# Patient Record
Sex: Female | Born: 1980 | Race: Black or African American | Hispanic: No | Marital: Single | Smoking: Former smoker
Health system: Southern US, Community
[De-identification: ages and names within clinical notes are randomized; demographics above are authoritative.]

## PROBLEM LIST (undated history)

## (undated) ENCOUNTER — Inpatient Hospital Stay (HOSPITAL_COMMUNITY): Payer: Self-pay

## (undated) DIAGNOSIS — E119 Type 2 diabetes mellitus without complications: Secondary | ICD-10-CM

## (undated) HISTORY — DX: Type 2 diabetes mellitus without complications: E11.9

---

## 2007-09-03 ENCOUNTER — Emergency Department (HOSPITAL_COMMUNITY): Admission: EM | Admit: 2007-09-03 | Discharge: 2007-09-03 | Payer: Self-pay | Admitting: Emergency Medicine

## 2008-03-27 ENCOUNTER — Other Ambulatory Visit: Admission: RE | Admit: 2008-03-27 | Discharge: 2008-03-27 | Payer: Self-pay | Admitting: Obstetrics and Gynecology

## 2010-01-25 ENCOUNTER — Encounter: Admission: RE | Admit: 2010-01-25 | Discharge: 2010-01-25 | Payer: Self-pay | Admitting: Family Medicine

## 2010-02-28 ENCOUNTER — Encounter: Admission: RE | Admit: 2010-02-28 | Discharge: 2010-02-28 | Payer: Self-pay | Admitting: Family Medicine

## 2010-03-25 ENCOUNTER — Encounter: Admission: RE | Admit: 2010-03-25 | Discharge: 2010-03-25 | Payer: Self-pay | Admitting: Family Medicine

## 2013-06-06 ENCOUNTER — Ambulatory Visit (INDEPENDENT_AMBULATORY_CARE_PROVIDER_SITE_OTHER): Payer: Managed Care, Other (non HMO)

## 2013-06-06 ENCOUNTER — Ambulatory Visit (INDEPENDENT_AMBULATORY_CARE_PROVIDER_SITE_OTHER): Payer: Managed Care, Other (non HMO) | Admitting: Podiatrist

## 2013-06-06 ENCOUNTER — Encounter: Payer: Self-pay | Admitting: Podiatrist

## 2013-06-06 VITALS — BP 105/75 | HR 82 | Resp 18

## 2013-06-06 DIAGNOSIS — M722 Plantar fascial fibromatosis: Secondary | ICD-10-CM

## 2013-06-06 MED ORDER — ETODOLAC ER 400 MG PO TB24: 400.0000 mg | ORAL_TABLET | Freq: Every day | ORAL | Status: DC

## 2013-06-06 NOTE — Patient Instructions (Signed)
Wear a good fitting shoe-  Running shoe brands I recommend are [employer], [employer] and [employer].  Always have a shoe fit specialist help you choose your shoes as there are many "varieties" of shoes and they can find you the best fit.  Fleet Feet sports/ [employer] ([place], [place], [place] [employer], [employer] [place] have trained staff to help you in this process.  The [employer] in [place] has a great selection of euro-comfort casual shoes with good comfort and support as well.  Plantar Fasciitis (Heel Spur Syndrome) with Rehab The plantar fascia is a fibrous, ligament-like, soft-tissue structure that spans the bottom of the foot. Plantar fasciitis is a condition that causes pain in the foot due to inflammation of the tissue. SYMPTOMS   Pain and tenderness on the underneath side of the foot.  Pain that worsens with standing or walking. CAUSES  Plantar fasciitis is caused by irritation and injury to the plantar fascia on the underneath side of the foot. Common mechanisms of injury include:  Direct trauma to bottom of the foot.  Damage to a small nerve that runs under the foot where the main fascia attaches to the heel bone. Stress placed on the plantar fascia due to any mild increased activity or injury RISK INCREASES WITH:   Obesity.  Poor strength and flexibility.  Improperly fitted shoes.  Tight calf muscles.  Flat feet.  Failure to warm-up properly before activity.  PREVENTION  Warm up and stretch properly before activity.  Strength, flexibility  Maintain a health body weight.  Avoid stress on the plantar fascia.  Wear properly fitted shoes, including arch supports for individuals who have flat feet. PROGNOSIS  If treated properly, then the symptoms of plantar fasciitis usually resolve without surgery. However, occasionally surgery is necessary. RELATED COMPLICATIONS   Recurrent symptoms that may result in a chronic  condition.  Problems of the lower back that are caused by compensating for the injury, such as limping.  Pain or weakness of the foot during push-off following surgery.  Chronic inflammation, scarring, and partial or complete fascia tear, occurring more often from repeated injections. TREATMENT  Treatment initially involves the use of ice and medication to help reduce pain and inflammation. The use of strengthening and stretching exercises may help reduce pain with activity, especially stretches of the Achilles tendon.  Your caregiver may recommend that you use arch supports to help reduce stress on the plantar fascia. Often, corticosteroid injections are given to reduce inflammation. If symptoms persist for greater than 6 months despite non-surgical (conservative), then surgery may be recommended.  MEDICATION   If pain medication is necessary, then nonsteroidal anti-inflammatory medications, such as aspirin and ibuprofen, or other minor pain relievers, such as acetaminophen, are often recommended. Corticosteroid injections may be given by your caregiver.  HEAT AND COLD  Cold treatment (icing) relieves pain and reduces inflammation. Cold treatment should be applied for 10 to 15 minutes every 2 to 3 hours for inflammation and pain and immediately after any activity that aggravates your symptoms. Use ice packs or massage the area with a piece of ice (ice massage).  Heat treatment may be used prior to performing the stretching and strengthening activities prescribed by your caregiver, physical therapist, or athletic trainer. Use a heat pack or soak the injury in warm water. SEEK IMMEDIATE MEDICAL CARE IF:  Treatment seems to offer no benefit, or the condition worsens.  Any medications produce adverse side effects.    EXERCISES-- perform each exercise a  total of 10-15 repetitions.  Hold for 30 seconds and perform 3 times per day   RANGE OF MOTION (ROM) AND STRETCHING EXERCISES - Plantar  Fasciitis (Heel Spur Syndrome) These exercises may help you when beginning to rehabilitate your injury.   While completing these exercises, remember:   Restoring tissue flexibility helps normal motion to return to the joints. This allows healthier, less painful movement and activity.  An effective stretch should be held for at least 30 seconds.  A stretch should never be painful. You should only feel a gentle lengthening or release in the stretched tissue. RANGE OF MOTION - Toe Extension, Flexion  Sit with your right / left leg crossed over your opposite knee.  Grasp your toes and gently pull them back toward the top of your foot. You should feel a stretch on the bottom of your toes and/or foot.  Hold this stretch for __________ seconds.  Now, gently pull your toes toward the bottom of your foot. You should feel a stretch on the top of your toes and or foot.  Hold this stretch for __________ seconds. Repeat __________ times. Complete this stretch __________ times per day.  RANGE OF MOTION - Ankle Dorsiflexion, Active Assisted  Remove shoes and sit on a chair that is preferably not on a carpeted surface.  Place right / left foot under knee. Extend your opposite leg for support.  Keeping your heel down, slide your right / left foot back toward the chair until you feel a stretch at your ankle or calf. If you do not feel a stretch, slide your bottom forward to the edge of the chair, while still keeping your heel down.  Hold this stretch for __________ seconds. Repeat __________ times. Complete this stretch __________ times per day.  STRETCH  Gastroc, Standing  Place hands on wall.  Extend right / left leg, keeping the front knee somewhat bent.  Slightly point your toes inward on your back foot.  Keeping your right / left heel on the floor and your knee straight, shift your weight toward the wall, not allowing your back to arch.  You should feel a gentle stretch in the right /  left calf. Hold this position for __________ seconds. Repeat __________ times. Complete this stretch __________ times per day. STRETCH  Soleus, Standing  Place hands on wall.  Extend right / left leg, keeping the other knee somewhat bent.  Slightly point your toes inward on your back foot.  Keep your right / left heel on the floor, bend your back knee, and slightly shift your weight over the back leg so that you feel a gentle stretch deep in your back calf.  Hold this position for __________ seconds. Repeat __________ times. Complete this stretch __________ times per day. STRETCH  Gastrocsoleus, Standing  Note: This exercise can place a lot of stress on your foot and ankle. Please complete this exercise only if specifically instructed by your caregiver.   Place the ball of your right / left foot on a step, keeping your other foot firmly on the same step.  Hold on to the wall or a rail for balance.  Slowly lift your other foot, allowing your body weight to press your heel down over the edge of the step.  You should feel a stretch in your right / left calf.  Hold this position for __________ seconds.  Repeat this exercise with a slight bend in your right / left knee. Repeat __________ times. Complete this stretch __________  times per day.  STRENGTHENING EXERCISES - Plantar Fasciitis (Heel Spur Syndrome)  These exercises may help you when beginning to rehabilitate your injury. They may resolve your symptoms with or without further involvement from your physician, physical therapist or athletic trainer. While completing these exercises, remember:   Muscles can gain both the endurance and the strength needed for everyday activities through controlled exercises.  Complete these exercises as instructed by your physician, physical therapist or athletic trainer. Progress the resistance and repetitions only as guided.

## 2013-06-06 NOTE — Progress Notes (Deleted)
Inject right, pf tape, meloxicam, shoe, inserts

## 2013-06-06 NOTE — Progress Notes (Signed)
   Subjective:    Patient ID: Allison Hess, female    DOB: 07/16/1980, 33 y.o.   MRN: [id number]  HPI right heel has been going on for a few months and hurts am and hurts after I sit and sore and tender and hurts on the bottom and I do have a night splint and a insert     Review of Systems  All other systems reviewed and are negative.       Objective:   Physical Exam GENERAL APPEARANCE: Alert, conversant. Appropriately groomed. No acute distress.  VASCULAR: Pedal pulses palpable at 2/4 DP and PT bilateral.  Capillary refill time is immediate to all digits,  Proximal to distal cooling it warm to warm.  Digital hair growth is present bilateral  NEUROLOGIC: sensation is intact epicritically and protectively to 5.07 monofilament at 5/5 sites bilateral.  Light touch is intact bilateral, vibratory sensation intact bilateral, achilles tendon reflex is intact bilateral.  MUSCULOSKELETAL: pain on palpation along the plantar fascia is present.  Inflammation at the insertion point on the calcaneus is noted. DERMATOLOGIC: skin color, texture, and turger are within normal limits.  No preulcerative lesions are seen, no interdigital maceration noted.  No open lesions present.  Digital nails are asymptomatic.     Assessment & Plan:  Plantar fasciitis right  Plan: Discussed etiology, pathology, conservative vs. Surgical therapies and at this time a plantar fascial injection was recommended.  The patient agreed and a sterile skin prep was applied.  An injection consisting of kenalog and marcaine mixture was infiltrated at the point of maximal tenderness on the right Heel.  The patient tolerated this well and was given instructions for aftercare. Plantar fascial taping, meloxicam. Shoe changes, and orthotic inserts discussed.

## 2013-06-27 ENCOUNTER — Ambulatory Visit: Payer: Managed Care, Other (non HMO) | Admitting: Podiatrist

## 2015-04-15 ENCOUNTER — Ambulatory Visit (HOSPITAL_COMMUNITY): Admission: RE | Admit: 2015-04-15 | Payer: Self-pay | Source: Ambulatory Visit

## 2015-04-22 ENCOUNTER — Ambulatory Visit (HOSPITAL_COMMUNITY): Payer: Self-pay

## 2015-08-25 ENCOUNTER — Ambulatory Visit (INDEPENDENT_AMBULATORY_CARE_PROVIDER_SITE_OTHER): Payer: Managed Care, Other (non HMO) | Admitting: Family Medicine

## 2015-08-25 ENCOUNTER — Encounter: Payer: Self-pay | Admitting: Family Medicine

## 2015-08-25 VITALS — BP 116/82 | HR 98 | Temp 98.0°F | Ht 65.0 in | Wt 219.4 lb

## 2015-08-25 DIAGNOSIS — E119 Type 2 diabetes mellitus without complications: Secondary | ICD-10-CM | POA: Diagnosis not present

## 2015-08-25 DIAGNOSIS — Z3169 Encounter for other general counseling and advice on procreation: Secondary | ICD-10-CM | POA: Diagnosis not present

## 2015-08-25 MED ORDER — SITAGLIPTIN PHOS-METFORMIN HCL 50-500 MG PO TABS: 1.0000 | ORAL_TABLET | Freq: Two times a day (BID) | ORAL | Status: DC

## 2015-08-25 NOTE — Patient Instructions (Signed)
It was very nice to meet you today I will be in touch with your labs asap- if you like sign up for mychart to get your labs faster I will set you up [place] [facility]

## 2015-08-25 NOTE — Progress Notes (Signed)
Pre visit review using our clinic tool,if applicable. No additional management support is needed unless otherwise documented below in the visit note.  

## 2015-08-25 NOTE — Progress Notes (Signed)
[facility] [address], [address] [place], [place] [phone] [phone]  Date:  08/25/2015   Name:  Allison Hess   DOB:  09/04/1980   MRN:  [id number]  PCP:  Abbe AmsterdamOPLAND,JESSICA, MD    Chief Complaint: Establish Care   History of Present Illness:  Allison Hess is a 35 y.o. very pleasant female patient who presents with the following:  Here today to establish care She had been a [facility] She did have a miscarriage in January of this year at about 8 weeks  She works for [employer]- she has been diabetic for 4 years.    She does not have children as of yet.  Just had the one miscarriage.  She and her husband are trying to become pregnant again She lives in [place] but works in [employer]  She is on [place].  Plain metformin has caused her issues with GI distress but she tolerates the janument well Her last A1c was over 6 months ago She does not want to take an HCG today. Prefers to wait a few days Never had any surgery She enjoys gardening- she finds that it relieves stress.   There are no active problems to display for this patient.   Past Medical History  Diagnosis Date  . Diabetes mellitus without complication (HCC)     No past surgical history on file.  Social History  Substance Use Topics  . Smoking status: Never Smoker   . Smokeless tobacco: Never Used  . Alcohol Use: No    Family History  Problem Relation Age of Onset  . Diabetes Mother   . Diabetes Father     Allergies  Allergen Reactions  . Amoxicillin   . Penicillins     Medication list has been reviewed and updated.  Current Outpatient Prescriptions on File Prior to Visit  Medication Sig Dispense Refill  . BAYER CONTOUR TEST test strip Reported on 08/25/2015    . etodolac (LODINE XL) 400 MG 24 hr tablet Take 1 tablet (400 mg total) by mouth daily. (Patient not taking: Reported on 08/25/2015) 30 tablet 3  . glimepiride (AMARYL) 1 MG tablet Reported on  08/25/2015    . metFORMIN (GLUCOPHAGE-XR) 500 MG 24 hr tablet Reported on 08/25/2015    . NUVARING 0.12-0.015 MG/24HR vaginal ring Reported on 08/25/2015     No current facility-administered medications on file prior to visit.    Review of Systems:  As per HPI- otherwise negative.   Physical Examination: Filed Vitals:   08/25/15 1602  BP: 116/82  Pulse: 98  Temp: 98 F (36.7 C)   Filed Vitals:   08/25/15 1602  Height: 5\' 5"  (1.651 m)  Weight: 219 lb 6.4 oz (99.519 kg)   Body mass index is 36.51 kg/(m^2). Ideal Body Weight: Weight in (lb) to have BMI = 25: 149.9  GEN: WDWN, NAD, Non-toxic, A & O x 3, obese, looks well HEENT: Atraumatic, Normocephalic. Neck supple. No masses, No LAD. Ears and Nose: No external deformity. CV: RRR, No M/G/R. No JVD. No thrill. No extra heart sounds. PULM: CTA B, no wheezes, crackles, rhonchi. No retractions. No resp. distress. No accessory muscle use. EXTR: No c/c/e NEURO Normal gait.  PSYCH: Normally interactive. Conversant. Not depressed or anxious appearing.  Calm demeanor.    Assessment and Plan: Controlled type 2 diabetes mellitus without complication, without long-term current use of insulin (HCC) - Plan: sitaGLIPtin-metformin (JANUMET) 50-500 MG tablet, CBC, Comprehensive metabolic  panel, Hemoglobin A1c  Encounter for preconception consultation - Plan: Ambulatory referral to Obstetrics / Gynecology, CANCELED: Ambulatory referral to Obstetrics / Gynecology  Refilled her DM medication and will check labs for her Discussed her miscarriage and offered support Referral to OBG to establish care Discussed janumet- use during pregnancy is not contraindicated. If she does come up pregnant she will contact me for guidance   Signed Abbe Amsterdam, MD

## 2015-08-26 LAB — COMPREHENSIVE METABOLIC PANEL
ALT: 14 U/L (ref 0–35)
AST: 13 U/L (ref 0–37)
Albumin: 4.1 g/dL (ref 3.5–5.2)
Alkaline Phosphatase: 66 U/L (ref 39–117)
BUN: 10 mg/dL (ref 6–23)
CALCIUM: 9.5 mg/dL (ref 8.4–10.5)
CO2: 29 meq/L (ref 19–32)
Chloride: 101 mEq/L (ref 96–112)
Creatinine, Ser: 0.74 mg/dL (ref 0.40–1.20)
GFR: 115.83 mL/min (ref >60.00)
Glucose, Bld: 126 mg/dL — ABNORMAL HIGH (ref 70–99)
Potassium: 4.4 mEq/L (ref 3.5–5.1)
Sodium: 136 mEq/L (ref 135–145)
Total Bilirubin: 0.4 mg/dL (ref 0.2–1.2)
Total Protein: 7.1 g/dL (ref 6.0–8.3)

## 2015-08-26 LAB — CBC
HCT: 36.7 % (ref 36.0–46.0)
Hemoglobin: 11.9 g/dL — ABNORMAL LOW (ref 12.0–15.0)
MCHC: 32.5 g/dL (ref 30.0–36.0)
MCV: 78.8 fl (ref 78.0–100.0)
PLATELETS: 355 10*3/uL (ref 150.0–400.0)
RBC: 4.67 Mil/uL (ref 3.87–5.11)
RDW: 15 % (ref 11.5–15.5)
WBC: 11.1 10*3/uL — ABNORMAL HIGH (ref 4.0–10.5)

## 2015-08-26 LAB — HEMOGLOBIN A1C: HEMOGLOBIN A1C: 7.2 % — AB (ref 4.6–6.5)

## 2015-08-30 ENCOUNTER — Encounter: Payer: Self-pay | Admitting: Obstetrics & Gynecology

## 2015-08-30 ENCOUNTER — Encounter: Payer: Self-pay | Admitting: Family Medicine

## 2015-08-30 MED ORDER — METFORMIN HCL 500 MG PO TABS: 500.0000 mg | ORAL_TABLET | Freq: Two times a day (BID) | ORAL | Status: DC

## 2015-08-30 NOTE — Addendum Note (Signed)
Addended by: Abbe AmsterdamOPLAND, Beautifull Cisar C on: 08/30/2015 11:01 AM   Modules accepted: Orders, Medications

## 2015-08-31 ENCOUNTER — Telehealth: Payer: Self-pay

## 2015-08-31 ENCOUNTER — Encounter: Payer: Self-pay | Admitting: Family Medicine

## 2015-08-31 NOTE — Telephone Encounter (Signed)
Called patient and made aware that she does not need to come in earlier for her New OB appt and Dr. Erin FullingHarraway Smith reccommends that she continue her metformin. Patient also made aware that we will do urine pregnancy test and then send her for formal ultrasound if positive. Patient states understanding and set appointment for tomorrow AM. Armandina StammerJennifer Henrik Orihuela RN BSN

## 2015-08-31 NOTE — Telephone Encounter (Signed)
Please call pt. I have reviewed her medications and her HgbA1c. There is no need for an earlier NOB appt. She should come in for an official UPT and then get a sono at radiology PRIOR to her appt (as opposed to in our ofc). Continue her Metformin for now.         Thx,,    clh-S

## 2015-09-01 ENCOUNTER — Other Ambulatory Visit (INDEPENDENT_AMBULATORY_CARE_PROVIDER_SITE_OTHER): Payer: Managed Care, Other (non HMO)

## 2015-09-01 DIAGNOSIS — Z3201 Encounter for pregnancy test, result positive: Secondary | ICD-10-CM

## 2015-09-01 DIAGNOSIS — N912 Amenorrhea, unspecified: Secondary | ICD-10-CM

## 2015-09-01 LAB — POCT URINE PREGNANCY: Preg Test, Ur: POSITIVE — AB

## 2015-09-01 NOTE — Addendum Note (Signed)
Addended by: Anell BarrHOWARD, Marilyne Haseley L on: 09/01/2015 08:31 AM   Modules accepted: Orders

## 2015-09-01 NOTE — Progress Notes (Signed)
Patient present for urine pregnancy test. LMP:07-28-15 patient is approx 5 weeks. Patient ordered placed per Dr. Erin FullingHarraway Smith for her to have viability ultrasound before New OB in June. Armandina StammerJennifer Rochelle Nephew RN BSN

## 2015-09-13 ENCOUNTER — Encounter (HOSPITAL_BASED_OUTPATIENT_CLINIC_OR_DEPARTMENT_OTHER): Payer: Self-pay

## 2015-09-13 ENCOUNTER — Ambulatory Visit (HOSPITAL_BASED_OUTPATIENT_CLINIC_OR_DEPARTMENT_OTHER)
Admission: RE | Admit: 2015-09-13 | Discharge: 2015-09-13 | Disposition: A | Discharge status: Home / Self Care | Payer: Managed Care, Other (non HMO) | Source: Ambulatory Visit | Attending: Obstetrics & Gynecology | Admitting: Obstetrics & Gynecology

## 2015-09-13 DIAGNOSIS — N9489 Other specified conditions associated with female genital organs and menstrual cycle: Secondary | ICD-10-CM | POA: Insufficient documentation

## 2015-09-13 DIAGNOSIS — N912 Amenorrhea, unspecified: Secondary | ICD-10-CM | POA: Insufficient documentation

## 2015-09-13 DIAGNOSIS — O3481 Maternal care for other abnormalities of pelvic organs, first trimester: Secondary | ICD-10-CM | POA: Diagnosis not present

## 2015-09-13 DIAGNOSIS — Z3A01 Less than 8 weeks gestation of pregnancy: Secondary | ICD-10-CM | POA: Diagnosis not present

## 2015-09-13 DIAGNOSIS — O3411 Maternal care for benign tumor of corpus uteri, first trimester: Secondary | ICD-10-CM | POA: Diagnosis not present

## 2015-09-14 ENCOUNTER — Telehealth: Payer: Self-pay | Admitting: *Deleted

## 2015-09-14 ENCOUNTER — Encounter: Payer: Self-pay | Admitting: Obstetrics & Gynecology

## 2015-09-14 NOTE — Telephone Encounter (Signed)
Pt notified that her early OB U/S showed a viable IUP with GA of 6 w 3 days   The is a small posterior fundal fibroid and a small ovarian cyst.  Dr Erin FullingHarraway-Smith will address with the patient on her initial prenatal visit.

## 2015-09-23 ENCOUNTER — Ambulatory Visit (INDEPENDENT_AMBULATORY_CARE_PROVIDER_SITE_OTHER): Payer: Managed Care, Other (non HMO) | Admitting: Obstetrics & Gynecology

## 2015-09-23 ENCOUNTER — Other Ambulatory Visit (HOSPITAL_COMMUNITY): Admission: RE | Admit: 2015-09-23 | Payer: Self-pay | Source: Ambulatory Visit

## 2015-09-23 ENCOUNTER — Encounter: Payer: Self-pay | Admitting: Obstetrics & Gynecology

## 2015-09-23 VITALS — BP 121/72 | HR 92 | Wt 214.0 lb

## 2015-09-23 DIAGNOSIS — O24919 Unspecified diabetes mellitus in pregnancy, unspecified trimester: Secondary | ICD-10-CM

## 2015-09-23 DIAGNOSIS — O0990 Supervision of high risk pregnancy, unspecified, unspecified trimester: Secondary | ICD-10-CM | POA: Insufficient documentation

## 2015-09-23 DIAGNOSIS — O24419 Gestational diabetes mellitus in pregnancy, unspecified control: Secondary | ICD-10-CM | POA: Insufficient documentation

## 2015-09-23 DIAGNOSIS — Z124 Encounter for screening for malignant neoplasm of cervix: Secondary | ICD-10-CM

## 2015-09-23 DIAGNOSIS — Z113 Encounter for screening for infections with a predominantly sexual mode of transmission: Secondary | ICD-10-CM | POA: Diagnosis not present

## 2015-09-23 DIAGNOSIS — Z36 Encounter for antenatal screening of mother: Secondary | ICD-10-CM

## 2015-09-23 LAB — COMPREHENSIVE METABOLIC PANEL
ALBUMIN: 3.8 g/dL (ref 3.6–5.1)
ALT: 10 U/L (ref 6–29)
AST: 11 U/L (ref 10–30)
Alkaline Phosphatase: 68 U/L (ref 33–115)
BUN: 7 mg/dL (ref 7–25)
CO2: 22 mmol/L (ref 20–31)
Calcium: 8.7 mg/dL (ref 8.6–10.2)
Chloride: 101 mmol/L (ref 98–110)
Creat: 0.64 mg/dL (ref 0.50–1.10)
Glucose, Bld: 228 mg/dL — ABNORMAL HIGH (ref 65–99)
Potassium: 4.1 mmol/L (ref 3.5–5.3)
Sodium: 135 mmol/L (ref 135–146)
Total Bilirubin: 0.4 mg/dL (ref 0.2–1.2)
Total Protein: 6.2 g/dL (ref 6.1–8.1)

## 2015-09-23 LAB — TSH: TSH: 0.83 mIU/L

## 2015-09-23 LAB — HEMOGLOBIN A1C
Hgb A1c MFr Bld: 7 % — ABNORMAL HIGH (ref <5.7)
Mean Plasma Glucose: 154 mg/dL

## 2015-09-23 NOTE — Progress Notes (Signed)
Bedside ultrasounds CRL: 1.44 cm which reveals 7 week 5 days and is consistent with LMP. FHR 170bpm. Allison StammerJennifer Toniesha Zellner RN BSN

## 2015-09-23 NOTE — Progress Notes (Signed)
Subjective:    Allison Hess is being seen today for her first obstetrical visit.  This is not a planned pregnancy. She is at 4122w1d gestation. Her obstetrical history is significant for diabetes since 2013. Relationship with FOB: significant other, living together. Patient does intend to breast feed. Pregnancy history fully reviewed.  Menstrual History: OB History    Gravida Para Term Preterm AB TAB SAB Ectopic Multiple Living   3    1  1    0      Menarche age: [age]  Patient's last menstrual period was 07/28/2015.    The following portions of the patient's history were reviewed and updated as appropriate: allergies, current medications, past family history, past medical history, past social history, past surgical history and problem list.  Review of Systems Pertinent items are noted in HPI.    Objective:  BP 121/72 mmHg  Pulse 92  Wt 214 lb (97.07 kg)  LMP 07/28/2015 General Appearance:    Alert, cooperative, no distress, appears stated age  Head:    Normocephalic, without obvious abnormality, atraumatic  Eyes:    conjunctiva/corneas clear, EOM's intact, both eyes  Ears:    Normal external ear canals, both ears  Nose:   Nares normal, septum midline, mucosa normal, no drainage    or sinus tenderness  Throat:   Lips, mucosa, and tongue normal; teeth and gums normal  Neck:   Supple, symmetrical, trachea midline, no adenopathy;    thyroid:  no enlargement/tenderness/nodules  Back:     Symmetric, no curvature, ROM normal, no CVA tenderness  Lungs:     Clear to auscultation bilaterally, respirations unlabored  Chest Wall:    No tenderness or deformity   Heart:    Regular rate and rhythm, S1 and S2 normal, no murmur, rub   or gallop  Breast Exam:    No tenderness, masses, or nipple abnormality  Abdomen:     Soft, non-tender, bowel sounds active all four quadrants,    no masses, no organomegaly  Genitalia:    Normal female without lesion, discharge or tenderness     Extremities:    Extremities normal, atraumatic, no cyanosis or edema  Pulses:   2+ and symmetric all extremities  Skin:   Skin color, texture, turgor normal, no rashes or lesions     Assessment:    Pregnancy at 8 and 1/7 weeks   Diabetes diabetes since 2013- reviewed risks of DM in pregnancy      Plan:    Initial labs drawn including TSH, baseline Pr:Cr ratio; HgbA1C Prenatal vitamins. Problem list reviewed and updated. First trimester screen ordered today AFP3 discussed: requested. Role of ultrasound in pregnancy discussed; fetal survey: requested. Amniocentesis discussed: not indicated. Follow up in 4 weeks. Begin ASA next visit Meet with nutritionist Meet with diabetic educator Will change to Glyburide next visit and add ASA 70% of 60 min visit spent on counseling and coordination of care.    Chandon Lazcano L. Harraway-Smith, M.D., Evern CoreFACOG

## 2015-09-23 NOTE — Patient Instructions (Addendum)
First Trimester of Pregnancy The first trimester of pregnancy is from week 1 until the end of week 12 (months 1 through 3). A week after a sperm fertilizes an egg, the egg will implant on the wall of the uterus. This embryo will begin to develop into a baby. Genes from you and your partner are forming the baby. The female genes determine whether the baby is a boy or a girl. At 6-8 weeks, the eyes and face are formed, and the heartbeat can be seen on ultrasound. At the end of 12 weeks, all the baby's organs are formed.  Now that you are pregnant, you will want to do everything you can to have a healthy baby. Two of the most important things are to get good prenatal care and to follow your health care provider's instructions. Prenatal care is all the medical care you receive before the baby's birth. This care will help prevent, find, and treat any problems during the pregnancy and childbirth. BODY CHANGES Your body goes through many changes during pregnancy. The changes vary from woman to woman.   You may gain or lose a couple of pounds at first.  You may feel sick to your stomach (nauseous) and throw up (vomit). If the vomiting is uncontrollable, call your health care provider.  You may tire easily.  You may develop headaches that can be relieved by medicines approved by your health care provider.  You may urinate more often. Painful urination may mean you have a bladder infection.  You may develop heartburn as a result of your pregnancy.  You may develop constipation because certain hormones are causing the muscles that push waste through your intestines to slow down.  You may develop hemorrhoids or swollen, bulging veins (varicose veins).  Your breasts may begin to grow larger and become tender. Your nipples may stick out more, and the tissue that surrounds them (areola) may become darker.  Your gums may bleed and may be sensitive to brushing and flossing.  Dark spots or blotches (chloasma,  mask of pregnancy) may develop on your face. This will likely fade after the baby is born.  Your menstrual periods will stop.  You may have a loss of appetite.  You may develop cravings for certain kinds of food.  You may have changes in your emotions from day to day, such as being excited to be pregnant or being concerned that something may go wrong with the pregnancy and baby.  You may have more vivid and strange dreams.  You may have changes in your hair. These can include thickening of your hair, rapid growth, and changes in texture. Some women also have hair loss during or after pregnancy, or hair that feels dry or thin. Your hair will most likely return to normal after your baby is born. WHAT TO EXPECT AT YOUR PRENATAL VISITS During a routine prenatal visit:  You will be weighed to make sure you and the baby are growing normally.  Your blood pressure will be taken.  Your abdomen will be measured to track your baby's growth.  The fetal heartbeat will be listened to starting around week 10 or 12 of your pregnancy.  Test results from any previous visits will be discussed. Your health care provider may ask you:  How you are feeling.  If you are feeling the baby move.  If you have had any abnormal symptoms, such as leaking fluid, bleeding, severe headaches, or abdominal cramping.  If you are using any tobacco products,   including cigarettes, chewing tobacco, and electronic cigarettes.  If you have any questions. Other tests that may be performed during your first trimester include:  Blood tests to find your blood type and to check for the presence of any previous infections. They will also be used to check for low iron levels (anemia) and Rh antibodies. Later in the pregnancy, blood tests for diabetes will be done along with other tests if problems develop.  Urine tests to check for infections, diabetes, or protein in the urine.  An ultrasound to confirm the proper growth  and development of the baby.  An amniocentesis to check for possible genetic problems.  Fetal screens for spina bifida and Down syndrome.  You may need other tests to make sure you and the baby are doing well.  HIV (human immunodeficiency virus) testing. Routine prenatal testing includes screening for HIV, unless you choose not to have this test. HOME CARE INSTRUCTIONS  Medicines  Follow your health care provider's instructions regarding medicine use. Specific medicines may be either safe or unsafe to take during pregnancy.  Take your prenatal vitamins as directed.  If you develop constipation, try taking a stool softener if your health care provider approves. Diet  Eat regular, well-balanced meals. Choose a variety of foods, such as meat or vegetable-based protein, fish, milk and low-fat dairy products, vegetables, fruits, and whole grain breads and cereals. Your health care provider will help you determine the amount of weight gain that is right for you.  Avoid raw meat and uncooked cheese. These carry germs that can cause birth defects in the baby.  Eating four or five small meals rather than three large meals a day may help relieve nausea and vomiting. If you start to feel nauseous, eating a few soda crackers can be helpful. Drinking liquids between meals instead of during meals also seems to help nausea and vomiting.  If you develop constipation, eat more high-fiber foods, such as fresh vegetables or fruit and whole grains. Drink enough fluids to keep your urine clear or pale yellow. Activity and Exercise  Exercise only as directed by your health care provider. Exercising will help you:  Control your weight.  Stay in shape.  Be prepared for labor and delivery.  Experiencing pain or cramping in the lower abdomen or low back is a good sign that you should stop exercising. Check with your health care provider before continuing normal exercises.  Try to avoid standing for long  periods of time. Move your legs often if you must stand in one place for a long time.  Avoid heavy lifting.  Wear low-heeled shoes, and practice good posture.  You may continue to have sex unless your health care provider directs you otherwise. Relief of Pain or Discomfort  Wear a good support bra for breast tenderness.   Take warm sitz baths to soothe any pain or discomfort caused by hemorrhoids. Use hemorrhoid cream if your health care provider approves.   Rest with your legs elevated if you have leg cramps or low back pain.  If you develop varicose veins in your legs, wear support hose. Elevate your feet for 15 minutes, 3-4 times a day. Limit salt in your diet. Prenatal Care  Schedule your prenatal visits by the twelfth week of pregnancy. They are usually scheduled monthly at first, then more often in the last 2 months before delivery.  Write down your questions. Take them to your prenatal visits.  Keep all your prenatal visits as directed by your   health care provider. Safety  Wear your seat belt at all times when driving.  Make a list of emergency phone numbers, including numbers for family, friends, the hospital, and police and fire departments. General Tips  Ask your health care provider for a referral to a local prenatal education class. Begin classes no later than at the beginning of month 6 of your pregnancy.  Ask for help if you have counseling or nutritional needs during pregnancy. Your health care provider can offer advice or refer you to specialists for help with various needs.  Do not use hot tubs, steam rooms, or saunas.  Do not douche or use tampons or scented sanitary pads.  Do not cross your legs for long periods of time.  Avoid cat litter boxes and soil used by cats. These carry germs that can cause birth defects in the baby and possibly loss of the fetus by miscarriage or stillbirth.  Avoid all smoking, herbs, alcohol, and medicines not prescribed by  your health care provider. Chemicals in these affect the formation and growth of the baby.  Do not use any tobacco products, including cigarettes, chewing tobacco, and electronic cigarettes. If you need help quitting, ask your health care provider. You may receive counseling support and other resources to help you quit.  Schedule a dentist appointment. At home, brush your teeth with a soft toothbrush and be gentle when you floss. SEEK MEDICAL CARE IF:   You have dizziness.  You have mild pelvic cramps, pelvic pressure, or nagging pain in the abdominal area.  You have persistent nausea, vomiting, or diarrhea.  You have a bad smelling vaginal discharge.  You have pain with urination.  You notice increased swelling in your face, hands, legs, or ankles. SEEK IMMEDIATE MEDICAL CARE IF:   You have a fever.  You are leaking fluid from your vagina.  You have spotting or bleeding from your vagina.  You have severe abdominal cramping or pain.  You have rapid weight gain or loss.  You vomit blood or material that looks like coffee grounds.  You are exposed to [place] measles and have never had them.  You are exposed to fifth disease or chickenpox.  You develop a severe headache.  You have shortness of breath.  You have any kind of trauma, such as from a fall or a car accident.   This information is not intended to replace advice given to you by your health care provider. Make sure you discuss any questions you have with your health care provider.   Document Released: 04/04/2001 Document Revised: 05/01/2014 Document Reviewed: 02/18/2013 Elsevier Interactive Patient Education 2016 [employer] Type 1 or Type 2 Diabetes Mellitus During Pregnancy Diabetes mellitus, often simply referred to as diabetes, is a long-term (chronic) disease. Type 1 diabetes occurs when the islet cells, which are in the pancreas and make the hormone insulin, are destroyed and can no longer make insulin.  Type 2 diabetes occurs when the pancreas does not make enough insulin, the cells are less responsive to the insulin that is made (insulin resistance), or both. Insulin is needed to move sugars from food into the tissue cells. The tissue cells use the sugars for energy. The lack of insulin or the lack of normal response to insulin causes excess sugars to build up in the blood instead of going into the tissue cells. As a result, high blood sugar (hyperglycemia) develops.  If blood glucose levels are kept in the normal range both before and during pregnancy,  women can have a healthy pregnancy. If your blood glucose levels are not well controlled, there may be risks to you, your unborn baby, and your labor and delivery. Also, there may be risks to your baby once he or she is born.  RISK FACTORS  You are predisposed to developing type 1 diabetes if someone in your family has diabetes and you are exposed to certain environmental triggers.  You have an increased chance of developing type 2 diabetes if you have a family history of diabetes and also have one or more of the following risk factors:  Being overweight.  Having an inactive lifestyle.  Having a history of consistently eating high-calorie foods. SYMPTOMS  Increased thirst (polydipsia).  Increased urination (polyuria).  Increased urination during the night (nocturia).  Weight loss. This weight loss may be rapid.  Frequent, recurring infections.  Tiredness (fatigue).  Weakness.  Vision changes, such as blurred vision.  Fruity smell to your breath.  Abdominal pain.  Nausea or vomiting. DIAGNOSIS  If you have risk factors for diabetes, you may be screened for undiagnosed type 2 diabetes at your first prenatal visit. If you have previously given birth and you had gestational diabetes, you should be screened. The screening should be performed 6-12 weeks after the child is born and repeated every 1-3 years after the first  test. Diabetes is diagnosed when blood glucose levels are increased. Your blood glucose level may be checked by one or more of the following blood tests:  A fasting blood glucose test. You will not be allowed to eat for at least 8 hours before a blood sample is taken.  A random blood glucose test. Your blood glucose is checked at any time of the day regardless of when you ate.  A hemoglobin A1c blood glucose test. A hemoglobin A1c test provides information about blood glucose control over the previous 3 months.  An oral glucose tolerance test (OGTT). Your blood glucose is measured after you have not eaten (fasted) for 1-3 hours and then after you drink a glucose-containing beverage. An OGTT is usually performed during weeks 24-28 of your pregnancy. TREATMENT   You will need to take diabetes medicine or insulin daily to keep blood glucose levels in the desired range.  You will need to match insulin dosing with exercise and healthy food choices. If you have type 1 or type 2 diabetes, your treatment goal is to maintain the following blood glucose levels during pregnancy:  Before meals (preprandial), at bedtime, and overnight: 60-99 mg/dL.  After meals (postprandial): peak of 100-129 mg/dL.  A1c: less than 7%. HOME CARE INSTRUCTIONS   Have your hemoglobin A1c level checked twice a year.  Perform daily blood glucose monitoring as directed by your health care provider. It is common to perform frequent blood glucose monitoring.  Monitor urine ketones when you are sick and as directed by your health care provider.  Take your diabetes medicine and insulin as directed by your health care provider to maintain your blood glucose level in the desired range.  Never run out of diabetes medicine or insulin. It is needed every day.  Adjust insulin based on your intake of carbohydrates. Carbohydrates can raise blood glucose levels but need to be included in your diet. Carbohydrates provide  vitamins, minerals, and fiber, which are an essential part of a healthy diet. Carbohydrates are found in fruits, vegetables, whole grains, dairy products, legumes, and foods containing added sugars.  Eat healthy foods. Alternate 3 meals with 3 snacks.  Maintain a healthy weight gain. The usual total expected weight gain varies according to your prepregnancy body mass index (BMI).  Carry a medical alert card or wear medical alert jewelry.  Carry a 15-gram carbohydrate snack with you at all times to treat low blood sugar (hypoglycemia). Some examples of 15-gram carbohydrate snacks include:  Glucose tablets, 3 or 4.  Glucose gel, 15-gram tube.  Raisins, 2 Tbsp (24 grams).  Jelly beans, 6.  Animal crackers, 8.  Fruit juice, regular soda, or low-fat milk, 4 ounces (120 mL).  Gummy treats, 9.  Recognize hypoglycemia. Hypoglycemia during pregnancy occurs with blood glucose levels of 60 mg/dL and below. The risk for hypoglycemia increases when fasting or skipping meals, during or after intense exercise, and during sleep. Hypoglycemia symptoms can include:  Tremors or shakes.  Decreased ability to concentrate.  Sweating.  Increased heart rate.  Headache.  Dry mouth.  Hunger.  Irritability.  Anxiety.  Restless sleep.  Altered speech or coordination.  Confusion.  Treat hypoglycemia promptly. If you are alert and able to safely swallow, follow the 15:15 rule:  Take 15-20 grams of rapid-acting glucose or carbohydrate. Rapid-acting options include glucose gel, glucose tablets, or 4 ounces (120 mL) of fruit juice, regular soda, or low-fat milk.  Check your blood glucose level 15 minutes after taking the glucose.  Take an additional 15-20 grams of glucose if the repeat blood glucose level is still 70 mg/dL or below.  Eat a meal or snack within 1 hour once blood glucose levels return to normal.  Engage in at least 30 minutes of physical activity a day or as directed by  your health care provider. Ten minutes of physical activity timed 30 minutes after each meal is encouraged to control postprandial blood glucose levels.  Watch for polyuria (excess urination) and polydipsia (feeling extra thirsty), which are early signs of hyperglycemia. An early awareness of hyperglycemia allows for prompt treatment. Treat hyperglycemia as directed by your health care provider.  Adjust your insulin dosing and food intake, as needed, if you start a new exercise or sport.  Follow your sick-day plan any time you are unable to eat or drink as usual.  Avoid tobacco and alcohol use.  Keep all follow-up visits as directed by your health care provider.  Follow the advice of your health care provider regarding your prenatal and post-delivery (postpartum) appointments, meal planning, exercise, medicines, vitamins, blood tests, other medical tests, and physical activities.  Continue daily skin and foot care. Examine your skin and feet daily for cuts, bruises, redness, nail problems, bleeding, blisters, or sores. A foot exam by a health care provider should be done annually.  Brush your teeth and gums at least twice a day and floss at least once a day. Follow up with your dentist regularly.  Schedule an eye exam during the first trimester of your pregnancy or as directed by your health care provider.  Share your diabetes management plan with your workplace or school.  Stay up-to-date with immunizations.  Learn to manage stress.  Obtain ongoing diabetes education and support as needed.  Your health care provider may recommend that you take one low-dose aspirin (81 mg) each day to help prevent high blood pressure during your pregnancy (preeclampsia or eclampsia). You may be at risk for preeclampsia or eclampsia if:  You had preeclampsia or eclampsia during a previous pregnancy.  Your baby did not grow as expected during a previous pregnancy.  You experienced preterm birth with a  previous pregnancy.  You experienced a separation of the placenta from the uterus (placental abruption) during a previous pregnancy.  You experienced the loss of your baby during a previous pregnancy.  You are pregnant with more than one baby.  You have other medical conditions, such as high blood pressure or autoimmune disease. SEEK MEDICAL CARE IF:   You are unable to eat food or drink fluids for more than 6 hours.  You have nausea and vomiting for more than 6 hours.  You have a blood glucose level of 200 mg/dL and you have ketones in your urine.  There is a change in mental status.  You develop vision problems.  You have a persistent headache.  You have upper abdominal pain or discomfort.  You have an additional serious sickness.  You have diarrhea for more than 6 hours.  You have been sick or have had a fever for 2 days and are not getting better. SEEK IMMEDIATE MEDICAL CARE IF:  You have difficulty breathing.  You no longer feel your baby moving.  You are bleeding or have discharge from your vagina.  You start having premature contractions or labor. MAKE SURE YOU:  Understand these instructions.  Will watch your condition.  Will get help right away if you are not doing well or get worse.   This information is not intended to replace advice given to you by your health care provider. Make sure you discuss any questions you have with your health care provider.   Document Released: 01/03/2012 Document Revised: 05/01/2014 Document Reviewed: 01/03/2012 Elsevier Interactive Patient Education [employer]

## 2015-09-24 LAB — OBSTETRIC PANEL
ANTIBODY SCREEN: NEGATIVE
Basophils Absolute: 0 cells/uL (ref 0–200)
Basophils Relative: 0 %
EOS PCT: 1 %
Eosinophils Absolute: 93 cells/uL (ref 15–500)
HEMATOCRIT: 38.2 % (ref 35.0–45.0)
Hemoglobin: 12.2 g/dL (ref 11.7–15.5)
Hepatitis B Surface Ag: NEGATIVE
Lymphocytes Relative: 19 %
Lymphs Abs: 1767 cells/uL (ref 850–3900)
MCH: 25.6 pg — ABNORMAL LOW (ref 27.0–33.0)
MCHC: 31.9 g/dL — ABNORMAL LOW (ref 32.0–36.0)
MCV: 80.1 fL (ref 80.0–100.0)
MPV: 10.5 fL (ref 7.5–12.5)
Monocytes Absolute: 465 cells/uL (ref 200–950)
Monocytes Relative: 5 %
Neutro Abs: 6975 cells/uL (ref 1500–7800)
Neutrophils Relative %: 75 %
Platelets: 405 10*3/uL — ABNORMAL HIGH (ref 140–400)
RBC: 4.77 MIL/uL (ref 3.80–5.10)
RDW: 14.7 % (ref 11.0–15.0)
RPR Ser Ql: REACTIVE — AB
Rh Type: POSITIVE
Rubella: 3.35 Index — ABNORMAL HIGH (ref <0.90)
WBC: 9.3 10*3/uL (ref 3.8–10.8)

## 2015-09-24 LAB — PROTEIN / CREATININE RATIO, URINE
Creatinine, Urine: 147 mg/dL (ref 20–320)
Protein Creatinine Ratio: 88 mg/g creat (ref 21–161)
Total Protein, Urine: 13 mg/dL (ref 5–24)

## 2015-09-24 LAB — RPR TITER: RPR Titer: 1:1 {titer}

## 2015-09-24 LAB — SICKLE CELL SCREEN: SICKLE CELL SCREEN: NEGATIVE

## 2015-09-24 LAB — CYTOLOGY - PAP

## 2015-09-24 LAB — HIV ANTIBODY (ROUTINE TESTING W REFLEX): HIV 1&2 Ab, 4th Generation: NONREACTIVE

## 2015-09-27 LAB — CULTURE, URINE COMPREHENSIVE: Colony Count: >=100000

## 2015-09-27 LAB — FLUORESCENT TREPONEMAL AB(FTA)-IGG-BLD: Fluorescent Treponemal ABS: NONREACTIVE

## 2015-09-28 ENCOUNTER — Encounter: Payer: Self-pay | Admitting: Obstetrics & Gynecology

## 2015-09-28 ENCOUNTER — Telehealth: Payer: Self-pay

## 2015-09-28 ENCOUNTER — Other Ambulatory Visit: Payer: Self-pay | Admitting: Obstetrics & Gynecology

## 2015-09-28 DIAGNOSIS — O2341 Unspecified infection of urinary tract in pregnancy, first trimester: Secondary | ICD-10-CM

## 2015-09-28 MED ORDER — NITROFURANTOIN MONOHYD MACRO 100 MG PO CAPS: 100.0000 mg | ORAL_CAPSULE | Freq: Two times a day (BID) | ORAL | Status: DC

## 2015-09-28 NOTE — Telephone Encounter (Signed)
Attempted to reach patient to get  her blood sugar readings. Armandina StammerJennifer Keishawn Darsey RN BSN

## 2015-09-29 ENCOUNTER — Telehealth: Payer: Self-pay

## 2015-09-29 ENCOUNTER — Encounter: Payer: Managed Care, Other (non HMO) | Attending: Obstetrics & Gynecology | Admitting: *Deleted

## 2015-09-29 ENCOUNTER — Encounter: Payer: Self-pay | Admitting: Obstetrics & Gynecology

## 2015-09-29 ENCOUNTER — Encounter: Payer: Self-pay | Admitting: *Deleted

## 2015-09-29 VITALS — Ht 64.0 in | Wt 211.9 lb

## 2015-09-29 DIAGNOSIS — Z7984 Long term (current) use of oral hypoglycemic drugs: Secondary | ICD-10-CM

## 2015-09-29 DIAGNOSIS — O24119 Pre-existing diabetes mellitus, type 2, in pregnancy, unspecified trimester: Secondary | ICD-10-CM | POA: Diagnosis not present

## 2015-09-29 NOTE — Patient Instructions (Signed)
Plan:  Aim for 2-3 Carb Choices per meal (30-45 grams) +/- 1 either way  Aim for 0-2 Carbs per snack   Include protein in moderation with your meals and snacks Consider reading food labels for Total Carbohydrate of foods Continue with your activity level daily as tolerated Continue checking BG at alternate times per day as directed by MD  Continue taking medication as directed by MD

## 2015-09-29 NOTE — Telephone Encounter (Signed)
Patient called and made aware of postive urine culture and need to treat that with antibiotics. Patient made aware antibiotic sent to pharmacy. Patient also made aware that RPR test came back positive but the fluorescent Tab came back non reactive making this a false positive. Patient states understanding.   This morning patient got blood sugar this morning 149. She states this is about the range it has been with her improving her diet. Patient confirms she is going to diabetes and nutrition appointment today at 3:30p  Armandina StammerJennifer Howard RN BSN

## 2015-10-07 NOTE — Progress Notes (Signed)
  Patient was seen on 09/29/2015 for Type 2 Diabetes with pregnancy. The following learning objectives were met by the patient during this course:   States the definition of Type 2 Diabetes and pregnancy  States why dietary management is important in controlling blood glucose  Describes the effects each nutrient has on blood glucose levels  Demonstrates ability to create a balanced meal plan  Demonstrates carbohydrate counting   States when to check blood glucose levels  Demonstrates proper blood glucose monitoring techniques  States the effect of stress and exercise on blood glucose levels  States the importance of limiting caffeine and abstaining from alcohol and smoking  Patient already has blood glucose monitor but requested a Log Book to record BG readings  Patient instructed to monitor glucose levels: FBS: 60 - <90 2 hour: <120  *Patient received handouts:  Nutrition Diabetes and Pregnancy  Carbohydrate Counting List  BG Log Book  Patient will be seen for follow-up as needed.

## 2015-10-18 ENCOUNTER — Encounter (HOSPITAL_COMMUNITY): Payer: Self-pay | Admitting: Obstetrics & Gynecology

## 2015-10-21 ENCOUNTER — Encounter: Payer: Self-pay | Admitting: Obstetrics & Gynecology

## 2015-10-21 ENCOUNTER — Ambulatory Visit (INDEPENDENT_AMBULATORY_CARE_PROVIDER_SITE_OTHER): Payer: Managed Care, Other (non HMO) | Admitting: Obstetrics & Gynecology

## 2015-10-21 VITALS — BP 106/53 | HR 82 | Wt 209.0 lb

## 2015-10-21 DIAGNOSIS — O24419 Gestational diabetes mellitus in pregnancy, unspecified control: Secondary | ICD-10-CM

## 2015-10-21 DIAGNOSIS — O0991 Supervision of high risk pregnancy, unspecified, first trimester: Secondary | ICD-10-CM

## 2015-10-21 DIAGNOSIS — O24919 Unspecified diabetes mellitus in pregnancy, unspecified trimester: Secondary | ICD-10-CM

## 2015-10-21 DIAGNOSIS — E119 Type 2 diabetes mellitus without complications: Secondary | ICD-10-CM

## 2015-10-21 MED ORDER — GLYBURIDE 2.5 MG PO TABS: 2.5000 mg | ORAL_TABLET | Freq: Two times a day (BID) | ORAL | Status: DC

## 2015-10-21 MED ORDER — METFORMIN HCL 1000 MG PO TABS: 1000.0000 mg | ORAL_TABLET | Freq: Two times a day (BID) | ORAL | Status: DC

## 2015-10-21 NOTE — Progress Notes (Signed)
Subjective:  Quintella Reichertaula Millican is a 35 y.o. G3P0010 at 6435w1d being seen today for ongoing prenatal care.  She is currently monitored for the following issues for this high-risk pregnancy and has Controlled type 2 diabetes mellitus without complication, without long-term current use of insulin (HCC); Supervision of high risk pregnancy, antepartum; and Gestational diabetes mellitus (GDM) affecting pregnancy, antepartum (HCC) on her problem list.  Patient reports nausea.  Contractions: Not present. Vag. Bleeding: None.   . Denies leaking of fluid.   The following portions of the patient's history were reviewed and updated as appropriate: allergies, current medications, past family history, past medical history, past social history, past surgical history and problem list. Problem list updated.  Objective:   Filed Vitals:   10/21/15 0803  BP: 106/53  Pulse: 82  Weight: 209 lb (94.802 kg)    Fetal Status: Fetal Heart Rate (bpm): 148         General:  Alert, oriented and cooperative. Patient is in no acute distress.  Skin: Skin is warm and dry. No rash noted.   Cardiovascular: Normal heart rate noted  Respiratory: Normal respiratory effort, no problems with respiration noted  Abdomen: Soft, gravid, appropriate for gestational age. Pain/Pressure: Absent     Pelvic: Cervical exam deferred        Extremities: Normal range of motion.  Edema: None  Mental Status: Normal mood and affect. Normal behavior. Normal judgment and thought content.   Urinalysis: Urine Protein: Negative Urine Glucose: Trace  Assessment and Plan:  Pregnancy: G3P0010 at 8135w1d  1. Supervision of high risk pregnancy, antepartum, first trimester Rec ginger for mild nausea  2. Controlled type 2 diabetes mellitus without complication, without long-term current use of insulin (HCC) Reviewed glucose log.  >90% of FSG are elevated.   Reviewed diet and exercise.   There is a trend toward improvement after visit with dietician  rec  added walking (pt walking on breaks at work) will add 30 min after work and increase intensity  3. Gestational diabetes mellitus (GDM) affecting pregnancy, antepartum (HCC)  - metFORMIN (GLUCOPHAGE) 1000 MG tablet; Take 1 tablet (1,000 mg total) by mouth 2 (two) times daily with a meal.  Dispense: 60 tablet; Refill: 3 - glyBURIDE (DIABETA) 2.5 MG tablet; Take 1 tablet (2.5 mg total) by mouth 2 (two) times daily with a meal.  Dispense: 60 tablet; Refill: 2  Preterm labor symptoms and general obstetric precautions including but not limited to vaginal bleeding, contractions, leaking of fluid and fetal movement were reviewed in detail with the patient. Please refer to After Visit Summary for other counseling recommendations.  Return in about 4 weeks (around 11/18/2015).   Willodean Rosenthalarolyn Harraway-Smith, MD

## 2015-10-21 NOTE — Progress Notes (Signed)
Has appt for first trimester screen July 5th

## 2015-10-21 NOTE — Patient Instructions (Signed)
Nausea and Vomiting Nausea is a sick feeling that often comes before throwing up (vomiting). Vomiting is a reflex where stomach contents come out of your mouth. Vomiting can cause severe loss of body fluids (dehydration). Children and elderly adults can become dehydrated quickly, especially if they also have diarrhea. Nausea and vomiting are symptoms of a condition or disease. It is important to find the cause of your symptoms. CAUSES   Direct irritation of the stomach lining. This irritation can result from increased acid production (gastroesophageal reflux disease), infection, food poisoning, taking certain medicines (such as nonsteroidal anti-inflammatory drugs), alcohol use, or tobacco use.  Signals from the brain.These signals could be caused by a headache, heat exposure, an inner ear disturbance, increased pressure in the brain from injury, infection, a tumor, or a concussion, pain, emotional stimulus, or metabolic problems.  An obstruction in the gastrointestinal tract (bowel obstruction).  Illnesses such as diabetes, hepatitis, gallbladder problems, appendicitis, kidney problems, cancer, sepsis, atypical symptoms of a heart attack, or eating disorders.  Medical treatments such as chemotherapy and radiation.  Receiving medicine that makes you sleep (general anesthetic) during surgery. DIAGNOSIS Your caregiver may ask for tests to be done if the problems do not improve after a few days. Tests may also be done if symptoms are severe or if the reason for the nausea and vomiting is not clear. Tests may include:  Urine tests.  Blood tests.  Stool tests.  Cultures (to look for evidence of infection).  X-rays or other imaging studies. Test results can help your caregiver make decisions about treatment or the need for additional tests. TREATMENT You need to stay well hydrated. Drink frequently but in small amounts.You may wish to drink water, sports drinks, clear broth, or eat frozen  ice pops or gelatin dessert to help stay hydrated.When you eat, eating slowly may help prevent nausea.There are also some antinausea medicines that may help prevent nausea. HOME CARE INSTRUCTIONS   Take all medicine as directed by your caregiver.  If you do not have an appetite, do not force yourself to eat. However, you must continue to drink fluids.  If you have an appetite, eat a normal diet unless your caregiver tells you differently.  Eat a variety of complex carbohydrates (rice, wheat, potatoes, bread), lean meats, yogurt, fruits, and vegetables.  Avoid high-fat foods because they are more difficult to digest.  Drink enough water and fluids to keep your urine clear or pale yellow.  If you are dehydrated, ask your caregiver for specific rehydration instructions. Signs of dehydration may include:  Severe thirst.  Dry lips and mouth.  Dizziness.  Dark urine.  Decreasing urine frequency and amount.  Confusion.  Rapid breathing or pulse. SEEK IMMEDIATE MEDICAL CARE IF:   You have blood or brown flecks (like coffee grounds) in your vomit.  You have black or bloody stools.  You have a severe headache or stiff neck.  You are confused.  You have severe abdominal pain.  You have chest pain or trouble breathing.  You do not urinate at least once every 8 hours.  You develop cold or clammy skin.  You continue to vomit for longer than 24 to 48 hours.  You have a fever. MAKE SURE YOU:   Understand these instructions.  Will watch your condition.  Will get help right away if you are not doing well or get worse.   This information is not intended to replace advice given to you by your health care provider. Make sure  you discuss any questions you have with your health care provider. °  °Document Released: 04/10/2005 Document Revised: 07/03/2011 Document Reviewed: 09/07/2010 °Elsevier Interactive Patient Education ©2016 Elsevier Inc. ° °Second Trimester of  Pregnancy °The second trimester is from week 13 through week 28, months 4 through 6. The second trimester is often a time when you feel your best. Your body has also adjusted to being pregnant, and you begin to feel better physically. Usually, morning sickness has lessened or quit completely, you may have more energy, and you may have an increase in appetite. The second trimester is also a time when the fetus is growing rapidly. At the end of the sixth month, the fetus is about 9 inches long and weighs about 1½ pounds. You will likely begin to feel the baby move (quickening) between 18 and 20 weeks of the pregnancy. °BODY CHANGES °Your body goes through many changes during pregnancy. The changes vary from woman to woman.  °· Your weight will continue to increase. You will notice your lower abdomen bulging out. °· You may begin to get stretch marks on your hips, abdomen, and breasts. °· You may develop headaches that can be relieved by medicines approved by your health care provider. °· You may urinate more often because the fetus is pressing on your bladder. °· You may develop or continue to have heartburn as a result of your pregnancy. °· You may develop constipation because certain hormones are causing the muscles that push waste through your intestines to slow down. °· You may develop hemorrhoids or swollen, bulging veins (varicose veins). °· You may have back pain because of the weight gain and pregnancy hormones relaxing your joints between the bones in your pelvis and as a result of a shift in weight and the muscles that support your balance. °· Your breasts will continue to grow and be tender. °· Your gums may bleed and may be sensitive to brushing and flossing. °· Dark spots or blotches (chloasma, mask of pregnancy) may develop on your face. This will likely fade after the baby is born. °· A dark line from your belly button to the pubic area (linea nigra) may appear. This will likely fade after the baby is  born. °· You may have changes in your hair. These can include thickening of your hair, rapid growth, and changes in texture. Some women also have hair loss during or after pregnancy, or hair that feels dry or thin. Your hair will most likely return to normal after your baby is born. °WHAT TO EXPECT AT YOUR PRENATAL VISITS °During a routine prenatal visit: °· You will be weighed to make sure you and the fetus are growing normally. °· Your blood pressure will be taken. °· Your abdomen will be measured to track your baby's growth. °· The fetal heartbeat will be listened to. °· Any test results from the previous visit will be discussed. °Your health care provider may ask you: °· How you are feeling. °· If you are feeling the baby move. °· If you have had any abnormal symptoms, such as leaking fluid, bleeding, severe headaches, or abdominal cramping. °· If you are using any tobacco products, including cigarettes, chewing tobacco, and electronic cigarettes. °· If you have any questions. °Other tests that may be performed during your second trimester include: °· Blood tests that check for: °¨ Low iron levels (anemia). °¨ Gestational diabetes (between 24 and 28 weeks). °¨ Rh antibodies. °· Urine tests to check for infections, diabetes, or protein   the urine.  An ultrasound to confirm the proper growth and development of the baby.  An amniocentesis to check for possible genetic problems.  Fetal screens for spina bifida and Down syndrome.  HIV (human immunodeficiency virus) testing. Routine prenatal testing includes screening for HIV, unless you choose not to have this test. HOME CARE INSTRUCTIONS   Avoid all smoking, herbs, alcohol, and unprescribed drugs. These chemicals affect the formation and growth of the baby.  Do not use any tobacco products, including cigarettes, chewing tobacco, and electronic cigarettes. If you need help quitting, ask your health care provider. You may receive counseling support and other resources  to help you quit.  Follow your health care provider's instructions regarding medicine use. There are medicines that are either safe or unsafe to take during pregnancy.  Exercise only as directed by your health care provider. Experiencing uterine cramps is a good sign to stop exercising.  Continue to eat regular, healthy meals.  Wear a good support bra for breast tenderness.  Do not use hot tubs, steam rooms, or saunas.  Wear your seat belt at all times when driving.  Avoid raw meat, uncooked cheese, cat litter boxes, and soil used by cats. These carry germs that can cause birth defects in the baby.  Take your prenatal vitamins.  Take 1500-2000 mg of calcium daily starting at the 20th week of pregnancy until you deliver your baby.  Try taking a stool softener (if your health care provider approves) if you develop constipation. Eat more high-fiber foods, such as fresh vegetables or fruit and whole grains. Drink plenty of fluids to keep your urine clear or pale yellow.  Take warm sitz baths to soothe any pain or discomfort caused by hemorrhoids. Use hemorrhoid cream if your health care provider approves.  If you develop varicose veins, wear support hose. Elevate your feet for 15 minutes, 3-4 times a day. Limit salt in your diet.  Avoid heavy lifting, wear low heel shoes, and practice good posture.  Rest with your legs elevated if you have leg cramps or low back pain.  Visit your dentist if you have not gone yet during your pregnancy. Use a soft toothbrush to brush your teeth and be gentle when you floss.  A sexual relationship may be continued unless your health care provider directs you otherwise.  Continue to go to all your prenatal visits as directed by your health care provider. SEEK MEDICAL CARE IF:   You have dizziness.  You have mild pelvic cramps, pelvic pressure, or nagging pain in the abdominal area.  You have persistent nausea, vomiting, or diarrhea.  You have a bad  smelling vaginal discharge.  You have pain with urination. SEEK IMMEDIATE MEDICAL CARE IF:   You have a fever.  You are leaking fluid from your vagina.  You have spotting or bleeding from your vagina.  You have severe abdominal cramping or pain.  You have rapid weight gain or loss.  You have shortness of breath with chest pain.  You notice sudden or extreme swelling of your face, hands, ankles, feet, or legs.  You have not felt your baby move in over an hour.  You have severe headaches that do not go away with medicine.  You have vision changes.   This information is not intended to replace advice given to you by your health care provider. Make sure you discuss any questions you have with your health care provider.   Document Released: 04/04/2001 Document Revised: 05/01/2014 Document Reviewed:  06/11/2012 Elsevier Interactive Patient Education [employer]2016 Elsevier Inc.

## 2015-10-25 ENCOUNTER — Encounter: Payer: Self-pay | Admitting: Family Medicine

## 2015-10-27 ENCOUNTER — Ambulatory Visit (HOSPITAL_COMMUNITY)
Admission: RE | Admit: 2015-10-27 | Discharge: 2015-10-27 | Disposition: A | Discharge status: Home / Self Care | Payer: Managed Care, Other (non HMO) | Source: Ambulatory Visit | Attending: Obstetrics & Gynecology | Admitting: Obstetrics & Gynecology

## 2015-10-27 ENCOUNTER — Encounter (HOSPITAL_COMMUNITY): Payer: Self-pay

## 2015-10-27 VITALS — BP 114/74 | HR 90 | Wt 209.6 lb

## 2015-10-27 DIAGNOSIS — O358XX0 Maternal care for other (suspected) fetal abnormality and damage, not applicable or unspecified: Secondary | ICD-10-CM | POA: Diagnosis present

## 2015-10-27 DIAGNOSIS — O0990 Supervision of high risk pregnancy, unspecified, unspecified trimester: Secondary | ICD-10-CM

## 2015-10-27 DIAGNOSIS — IMO0002 Reserved for concepts with insufficient information to code with codable children: Secondary | ICD-10-CM

## 2015-11-01 ENCOUNTER — Encounter: Payer: Self-pay | Admitting: Obstetrics & Gynecology

## 2015-11-01 ENCOUNTER — Telehealth: Payer: Self-pay | Admitting: Obstetrics & Gynecology

## 2015-11-01 DIAGNOSIS — O359XX0 Maternal care for (suspected) fetal abnormality and damage, unspecified, not applicable or unspecified: Secondary | ICD-10-CM | POA: Insufficient documentation

## 2015-11-01 NOTE — Telephone Encounter (Signed)
TC to pt to review the results of her sono.  Pt had a few questions. Pt has a f/u with MFM in 1 week for repeat sono.  Pt had questions re her FMLA paperwork.  Will forward these to [place].  clh-S

## 2015-11-12 ENCOUNTER — Encounter (HOSPITAL_COMMUNITY): Payer: Self-pay

## 2015-11-12 ENCOUNTER — Ambulatory Visit (HOSPITAL_COMMUNITY)
Admission: RE | Admit: 2015-11-12 | Discharge: 2015-11-12 | Disposition: A | Discharge status: Home / Self Care | Payer: Managed Care, Other (non HMO) | Source: Ambulatory Visit | Attending: Obstetrics & Gynecology | Admitting: Obstetrics & Gynecology

## 2015-11-12 VITALS — BP 111/64 | HR 92 | Wt 206.0 lb

## 2015-11-12 DIAGNOSIS — O99212 Obesity complicating pregnancy, second trimester: Secondary | ICD-10-CM | POA: Diagnosis not present

## 2015-11-12 DIAGNOSIS — O24112 Pre-existing diabetes mellitus, type 2, in pregnancy, second trimester: Secondary | ICD-10-CM | POA: Diagnosis present

## 2015-11-12 DIAGNOSIS — O289 Unspecified abnormal findings on antenatal screening of mother: Secondary | ICD-10-CM | POA: Diagnosis not present

## 2015-11-12 DIAGNOSIS — Z3A15 15 weeks gestation of pregnancy: Secondary | ICD-10-CM | POA: Insufficient documentation

## 2015-11-12 DIAGNOSIS — IMO0002 Reserved for concepts with insufficient information to code with codable children: Secondary | ICD-10-CM

## 2015-11-12 DIAGNOSIS — O0990 Supervision of high risk pregnancy, unspecified, unspecified trimester: Secondary | ICD-10-CM

## 2015-11-12 DIAGNOSIS — O359XX0 Maternal care for (suspected) fetal abnormality and damage, unspecified, not applicable or unspecified: Secondary | ICD-10-CM

## 2015-11-17 ENCOUNTER — Ambulatory Visit (INDEPENDENT_AMBULATORY_CARE_PROVIDER_SITE_OTHER): Payer: Managed Care, Other (non HMO) | Admitting: Family Medicine

## 2015-11-17 VITALS — BP 116/54 | HR 78 | Wt 207.0 lb

## 2015-11-17 DIAGNOSIS — O359XX0 Maternal care for (suspected) fetal abnormality and damage, unspecified, not applicable or unspecified: Secondary | ICD-10-CM

## 2015-11-17 DIAGNOSIS — O24919 Unspecified diabetes mellitus in pregnancy, unspecified trimester: Secondary | ICD-10-CM

## 2015-11-17 DIAGNOSIS — O0990 Supervision of high risk pregnancy, unspecified, unspecified trimester: Secondary | ICD-10-CM

## 2015-11-17 DIAGNOSIS — O24419 Gestational diabetes mellitus in pregnancy, unspecified control: Secondary | ICD-10-CM

## 2015-11-17 MED ORDER — METFORMIN HCL 1000 MG PO TABS: 1000.0000 mg | ORAL_TABLET | Freq: Two times a day (BID) | ORAL | 3 refills | Status: DC

## 2015-11-17 MED ORDER — GLYBURIDE 5 MG PO TABS: 5.0000 mg | ORAL_TABLET | Freq: Two times a day (BID) | ORAL | 3 refills | Status: DC

## 2015-11-17 NOTE — Patient Instructions (Signed)
Send me a copy of your log book in 2 weeks to review your blood sugars. Watch your diet!  Try to pay attention to meals and foods that make your blood sugar elevated. 30 minutes of exercise 3-4 times a week.

## 2015-11-17 NOTE — Progress Notes (Signed)
Subjective:  Allison Hess is a 35 y.o. G2P0010 at [redacted]w[redacted]d being seen today for ongoing prenatal care.  She is currently monitored for the following issues for this high-risk pregnancy and has Controlled type 2 diabetes mellitus without complication, without long-term current use of insulin (HCC); Supervision of high risk pregnancy, antepartum; Gestational diabetes mellitus (GDM) affecting pregnancy, antepartum (HCC); and Known or suspected fetal anomaly affecting care of mother, antepartum on her problem list.  GDM: Patient taking Metformin 1000mg  BID, Glyburide 2.5mg  in AM and 5mg  at bedtime.  This was increased last week at MFM.  Reports no hypoglycemic episodes.  Tolerating medication well Fasting: 88-96 since change.  120s-140s before that 2hr PP: 100-187 over past 5 days.    Patient reports no complaints.  Contractions: Not present. Vag. Bleeding: None.   . Denies leaking of fluid.   The following portions of the patient's history were reviewed and updated as appropriate: allergies, current medications, past family history, past medical history, past social history, past surgical history and problem list. Problem list updated.  Objective:   Vitals:   11/17/15 0817  BP: (!) 116/54  Pulse: 78  Weight: 207 lb (93.9 kg)    Fetal Status: Fetal Heart Rate (bpm): 145         General:  Alert, oriented and cooperative. Patient is in no acute distress.  Skin: Skin is warm and dry. No rash noted.   Cardiovascular: Normal heart rate noted  Respiratory: Normal respiratory effort, no problems with respiration noted  Abdomen: Soft, gravid, appropriate for gestational age. Pain/Pressure: Absent     Pelvic: Vag. Bleeding: None Vag D/C Character: Thin   Cervical exam deferred        Extremities: Normal range of motion.  Edema: None  Mental Status: Normal mood and affect. Normal behavior. Normal judgment and thought content.   Urinalysis: Urine Protein: Negative Urine Glucose: Negative  Assessment  and Plan:  Pregnancy: G2P0010 at [redacted]w[redacted]d  1. Supervision of high risk pregnancy, antepartum, unspecified trimester FHT and FH normal  2. Gestational diabetes mellitus (GDM) affecting pregnancy, antepartum (HCC) Increase glyburide to 5mg  BID.  Continue metformin. 30 minutes of exercise 3-4 times a week. Patient missing a few evening blood sugars.  Reenforced obtaining these blood sugars. Discussed diet  3. Known or suspected fetal anomaly affecting care of mother, antepartum, not applicable or unspecified fetus F/u US.  Preterm labor symptoms and general obstetric precautions including but not limited to vaginal bleeding, contractions, leaking of fluid and fetal movement were reviewed in detail with the patient. Please refer to After Visit Summary for other counseling recommendations.  No Follow-up on file.   Levie Heritage, DO

## 2015-11-25 ENCOUNTER — Encounter: Payer: Self-pay | Admitting: Obstetrics & Gynecology

## 2015-12-01 ENCOUNTER — Ambulatory Visit (HOSPITAL_COMMUNITY)
Admission: RE | Admit: 2015-12-01 | Discharge: 2015-12-01 | Disposition: A | Discharge status: Home / Self Care | Payer: Managed Care, Other (non HMO) | Source: Ambulatory Visit | Attending: Obstetrics & Gynecology | Admitting: Obstetrics & Gynecology

## 2015-12-01 ENCOUNTER — Encounter (HOSPITAL_COMMUNITY): Payer: Self-pay

## 2015-12-01 DIAGNOSIS — Z3A18 18 weeks gestation of pregnancy: Secondary | ICD-10-CM | POA: Insufficient documentation

## 2015-12-01 DIAGNOSIS — O359XX0 Maternal care for (suspected) fetal abnormality and damage, unspecified, not applicable or unspecified: Secondary | ICD-10-CM | POA: Diagnosis present

## 2015-12-02 ENCOUNTER — Other Ambulatory Visit (HOSPITAL_COMMUNITY): Payer: Self-pay | Admitting: *Deleted

## 2015-12-02 DIAGNOSIS — O359XX0 Maternal care for (suspected) fetal abnormality and damage, unspecified, not applicable or unspecified: Secondary | ICD-10-CM

## 2015-12-03 DIAGNOSIS — O24419 Gestational diabetes mellitus in pregnancy, unspecified control: Secondary | ICD-10-CM

## 2015-12-06 ENCOUNTER — Encounter: Payer: Self-pay | Admitting: Obstetrics & Gynecology

## 2015-12-06 MED ORDER — GLYBURIDE 5 MG PO TABS: 5.0000 mg | ORAL_TABLET | Freq: Two times a day (BID) | ORAL | 3 refills | Status: DC

## 2015-12-06 NOTE — Telephone Encounter (Signed)
Called patient and discussed pain - lower pelvic pain, worse when sitting and better when active.  No contractions/cramping.  Pain fairly mild.  Patient to call if worsens.

## 2015-12-07 ENCOUNTER — Telehealth: Payer: Self-pay | Admitting: Family Medicine

## 2015-12-07 ENCOUNTER — Encounter: Payer: Self-pay | Admitting: Obstetrics & Gynecology

## 2015-12-07 ENCOUNTER — Telehealth: Payer: Self-pay | Admitting: *Deleted

## 2015-12-07 NOTE — Telephone Encounter (Signed)
Received a call from Marva Chilsom from the [facility] in [place] stating that patient had call wanting to be seen today because of pain.  Pt had emailed Dr Adrian BlackwaterStinson yesterday and he had responded to her.  Marva told the patient that she did not have a physician in the office today but could have a nurse to call her or if she was in severe pain to go to [facility].  Pt hung up on Marva.  Asencion IslamMarva called me to tell me about the situation.  I called patient only to get a voicemail message that stated the mailbox is full and could not leave a message.  I was going to triage the call and if I felt patient needed to be seen without going to [facility] I would offer her an appt in [place] office today.

## 2015-12-08 ENCOUNTER — Ambulatory Visit (INDEPENDENT_AMBULATORY_CARE_PROVIDER_SITE_OTHER): Payer: Managed Care, Other (non HMO) | Admitting: Obstetrics & Gynecology

## 2015-12-08 VITALS — BP 125/58 | HR 92 | Wt 203.0 lb

## 2015-12-08 DIAGNOSIS — O24912 Unspecified diabetes mellitus in pregnancy, second trimester: Secondary | ICD-10-CM | POA: Diagnosis not present

## 2015-12-08 DIAGNOSIS — O24419 Gestational diabetes mellitus in pregnancy, unspecified control: Secondary | ICD-10-CM

## 2015-12-08 DIAGNOSIS — O359XX1 Maternal care for (suspected) fetal abnormality and damage, unspecified, fetus 1: Secondary | ICD-10-CM

## 2015-12-08 DIAGNOSIS — O359XX0 Maternal care for (suspected) fetal abnormality and damage, unspecified, not applicable or unspecified: Secondary | ICD-10-CM

## 2015-12-08 DIAGNOSIS — O0992 Supervision of high risk pregnancy, unspecified, second trimester: Secondary | ICD-10-CM

## 2015-12-08 NOTE — Patient Instructions (Signed)
Round Ligament Pain  The round ligament is a cord of muscle and tissue that helps to support the uterus. It can become a source of pain during pregnancy if it becomes stretched or twisted as the baby grows. The pain usually begins in the second trimester of pregnancy, and it can come and go until the baby is delivered. It is not a serious problem, and it does not cause harm to the baby.  Round ligament pain is usually a short, sharp, and pinching pain, but it can also be a dull, lingering, and aching pain. The pain is felt in the lower side of the abdomen or in the groin. It usually starts deep in the groin and moves up to the outside of the hip area. Pain can occur with:   A sudden change in position.   Rolling over in bed.   Coughing or sneezing.   Physical activity.  HOME CARE INSTRUCTIONS  Watch your condition for any changes. Take these steps to help with your pain:   When the pain starts, relax. Then try:    Sitting down.    Flexing your knees up to your abdomen.    Lying on your side with one pillow under your abdomen and another pillow between your legs.    Sitting in a warm bath for 15-20 minutes or until the pain goes away.   Take over-the-counter and prescription medicines only as told by your health care provider.   Move slowly when you sit and stand.   Avoid long walks if they cause pain.   Stop or lessen your physical activities if they cause pain.  SEEK MEDICAL CARE IF:   Your pain does not go away with treatment.   You feel pain in your back that you did not have before.   Your medicine is not helping.  SEEK IMMEDIATE MEDICAL CARE IF:   You develop a fever or chills.   You develop uterine contractions.   You develop vaginal bleeding.   You develop nausea or vomiting.   You develop diarrhea.   You have pain when you urinate.     This information is not intended to replace advice given to you by your health care provider. Make sure you discuss any questions you have with your health  care provider.     Document Released: 01/18/2008 Document Revised: 07/03/2011 Document Reviewed: 06/17/2014  Elsevier Interactive Patient Education 2016 Elsevier Inc.

## 2015-12-09 NOTE — Progress Notes (Signed)
Subjective:  Allison Hess is a 35 y.o. G2P0010 at 5133w1d being seen today for ongoing prenatal care.  She is currently monitored for the following issues for this high-risk pregnancy and has Controlled type 2 diabetes mellitus without complication, without long-term current use of insulin (HCC); Supervision of high risk pregnancy, antepartum; Gestational diabetes mellitus (GDM) affecting pregnancy, antepartum (HCC); and Known or suspected fetal anomaly affecting care of mother, antepartum on her problem list.  Patient reports lower pelvic pain.  NO bleeding.  No abd discharge..   Allison Hess. Vag. Bleeding: None.  Movement: Present. Denies leaking of fluid.   The following portions of the patient's history were reviewed and updated as appropriate: allergies, current medications, past family history, past medical history, past social history, past surgical history and problem list. Problem list updated.  Objective:   Vitals:   12/08/15 1525  BP: (!) 125/58  Pulse: 92  Weight: 203 lb (92.1 kg)    Fetal Status: Fetal Heart Rate (bpm): 150   Movement: Present     General:  Alert, oriented and cooperative. Patient is in no acute distress.  Skin: Skin is warm and dry. No rash noted.   Cardiovascular: Normal heart rate noted  Respiratory: Normal respiratory effort, no problems with respiration noted  Abdomen: Soft, gravid, appropriate for gestational age. Pain/Pressure: Absent     Pelvic:  Cervical exam deferred        Extremities: Normal range of motion.  Edema: None  Mental Status: Normal mood and affect. Normal behavior. Normal judgment and thought content.   Urinalysis: Urine Protein: Negative Urine Glucose: Negative  Assessment and Plan:  Pregnancy: G2P0010 at 7433w1d  1. Supervision of high risk pregnancy, antepartum, second trimester  - Alpha fetoprotein, maternal Round ligament pain  2. Known or suspected fetal anomaly affecting care of mother, antepartum, fetus 1  - Alpha fetoprotein,  maternal  3. Gestational diabetes mellitus (GDM) affecting pregnancy, antepartum (HCC)  Glucose log reviewed.  Pt still with some elevated fasting Glc. She just recently begin Glyburide 5mg  q HS and 2.5mg  in the am.  She reports     Preterm labor symptoms and general obstetric precautions including but not limited to vaginal bleeding, contractions, leaking of fluid and fetal movement were reviewed in detail with the patient. Please refer to After Visit Summary for other counseling recommendations.  Return in about 3 weeks (around 12/29/2015).   Allison Rosenthalarolyn Harraway-Smith, MD

## 2015-12-10 LAB — ALPHA FETOPROTEIN, MATERNAL
AFP: 54.4 ng/mL
CURR GEST AGE: 19 wk
MOM FOR AFP: 1.17
Open Spina bifida: NEGATIVE
Osb Risk: 1:15000 {titer}

## 2015-12-16 ENCOUNTER — Encounter: Payer: Managed Care, Other (non HMO) | Admitting: Obstetrics & Gynecology

## 2015-12-16 ENCOUNTER — Encounter: Payer: Self-pay | Admitting: Obstetrics & Gynecology

## 2015-12-23 ENCOUNTER — Encounter: Payer: Self-pay | Admitting: Obstetrics & Gynecology

## 2015-12-29 ENCOUNTER — Ambulatory Visit (HOSPITAL_COMMUNITY)
Admission: RE | Admit: 2015-12-29 | Discharge: 2015-12-29 | Disposition: A | Discharge status: Home / Self Care | Payer: Managed Care, Other (non HMO) | Source: Ambulatory Visit | Attending: Obstetrics & Gynecology | Admitting: Obstetrics & Gynecology

## 2015-12-29 ENCOUNTER — Encounter (HOSPITAL_COMMUNITY): Payer: Self-pay

## 2015-12-29 ENCOUNTER — Other Ambulatory Visit (HOSPITAL_COMMUNITY): Payer: Self-pay | Admitting: Maternal and Fetal Medicine

## 2015-12-29 DIAGNOSIS — Z3A22 22 weeks gestation of pregnancy: Secondary | ICD-10-CM | POA: Insufficient documentation

## 2015-12-29 DIAGNOSIS — O358XX0 Maternal care for other (suspected) fetal abnormality and damage, not applicable or unspecified: Secondary | ICD-10-CM | POA: Diagnosis not present

## 2015-12-29 DIAGNOSIS — O359XX0 Maternal care for (suspected) fetal abnormality and damage, unspecified, not applicable or unspecified: Secondary | ICD-10-CM

## 2015-12-29 DIAGNOSIS — IMO0002 Reserved for concepts with insufficient information to code with codable children: Secondary | ICD-10-CM

## 2015-12-29 DIAGNOSIS — Z36 Encounter for antenatal screening of mother: Secondary | ICD-10-CM | POA: Diagnosis not present

## 2015-12-29 DIAGNOSIS — Z0489 Encounter for examination and observation for other specified reasons: Secondary | ICD-10-CM

## 2015-12-30 ENCOUNTER — Other Ambulatory Visit (HOSPITAL_COMMUNITY): Payer: Self-pay | Admitting: *Deleted

## 2015-12-30 ENCOUNTER — Ambulatory Visit (INDEPENDENT_AMBULATORY_CARE_PROVIDER_SITE_OTHER): Payer: Managed Care, Other (non HMO) | Admitting: Family Medicine

## 2015-12-30 VITALS — BP 105/59 | HR 102 | Wt 205.0 lb

## 2015-12-30 DIAGNOSIS — O358XX0 Maternal care for other (suspected) fetal abnormality and damage, not applicable or unspecified: Secondary | ICD-10-CM

## 2015-12-30 DIAGNOSIS — E119 Type 2 diabetes mellitus without complications: Secondary | ICD-10-CM

## 2015-12-30 DIAGNOSIS — O24419 Gestational diabetes mellitus in pregnancy, unspecified control: Secondary | ICD-10-CM

## 2015-12-30 DIAGNOSIS — O359XX0 Maternal care for (suspected) fetal abnormality and damage, unspecified, not applicable or unspecified: Secondary | ICD-10-CM | POA: Insufficient documentation

## 2015-12-30 DIAGNOSIS — O24919 Unspecified diabetes mellitus in pregnancy, unspecified trimester: Secondary | ICD-10-CM

## 2015-12-30 DIAGNOSIS — O35EXX0 Maternal care for other (suspected) fetal abnormality and damage, fetal genitourinary anomalies, not applicable or unspecified: Secondary | ICD-10-CM

## 2015-12-30 DIAGNOSIS — O0992 Supervision of high risk pregnancy, unspecified, second trimester: Secondary | ICD-10-CM

## 2015-12-30 MED ORDER — GLYBURIDE 5 MG PO TABS: 7.5000 mg | ORAL_TABLET | Freq: Two times a day (BID) | ORAL | 3 refills | Status: DC

## 2015-12-30 NOTE — Progress Notes (Signed)
Subjective:  Quintella Reichertaula Tisby is a 35 y.o. G2P0010 at 4077w1d being seen today for ongoing prenatal care.  She is currently monitored for the following issues for this high-risk pregnancy and has Controlled type 2 diabetes mellitus without complication, without long-term current use of insulin (HCC); Supervision of high risk pregnancy, antepartum; Gestational diabetes mellitus (GDM) affecting pregnancy, antepartum (HCC); and Known or suspected fetal anomaly affecting care of mother, antepartum on her problem list.  GDM: Patient taking Metformin 1000mg  BID and Glyburide 5mg  qAM and 7.5mg  qHS.  Reports occasional hypoglycemic episodes.  Tolerating medication well Fasting: 740-127 (9/14 elevated, but within goal on days that does not have bedtime snack) 2hr PP: 74-148 (most elevated)  Patient reports no complaints.  Contractions: Not present. Vag. Bleeding: None.  Movement: Present. Denies leaking of fluid.   The following portions of the patient's history were reviewed and updated as appropriate: allergies, current medications, past family history, past medical history, past social history, past surgical history and problem list. Problem list updated.  Objective:   Vitals:   12/30/15 0914  BP: (!) 105/59  Pulse: (!) 102  Weight: 205 lb (93 kg)    Fetal Status: Fetal Heart Rate (bpm): 145   Movement: Present     General:  Alert, oriented and cooperative. Patient is in no acute distress.  Skin: Skin is warm and dry. No rash noted.   Cardiovascular: Normal heart rate noted  Respiratory: Normal respiratory effort, no problems with respiration noted  Abdomen: Soft, gravid, appropriate for gestational age. Pain/Pressure: Absent     Pelvic: Vag. Bleeding: None Vag D/C Character: Thin   Cervical exam deferred        Extremities: Normal range of motion.  Edema: None  Mental Status: Normal mood and affect. Normal behavior. Normal judgment and thought content.   Urinalysis: Urine Protein: Negative Urine  Glucose: Trace  Assessment and Plan:  Pregnancy: G2P0010 at 1577w1d  1. Supervision of high risk pregnancy, antepartum, second trimester FHT normal.  2. Controlled type 2 diabetes mellitus without complication, without long-term current use of insulin (HCC) Continue metformin Increase glyburide to 7.5mg  BID.  Eliminate bedtime snack.  3.  Fetal abnormality on US MFM recommended referral to pediatric urologist - will make for later this trimester (4-6 weeks)  Preterm labor symptoms and general obstetric precautions including but not limited to vaginal bleeding, contractions, leaking of fluid and fetal movement were reviewed in detail with the patient. Please refer to After Visit Summary for other counseling recommendations.  No Follow-up on file.   Levie HeritageJacob J Stinson, DO

## 2016-01-13 ENCOUNTER — Encounter: Payer: Managed Care, Other (non HMO) | Admitting: Family Medicine

## 2016-01-18 ENCOUNTER — Encounter: Payer: Self-pay | Admitting: Obstetrics & Gynecology

## 2016-01-20 ENCOUNTER — Ambulatory Visit (INDEPENDENT_AMBULATORY_CARE_PROVIDER_SITE_OTHER): Payer: Managed Care, Other (non HMO) | Admitting: Family Medicine

## 2016-01-20 VITALS — BP 117/57 | HR 88 | Wt 205.0 lb

## 2016-01-20 DIAGNOSIS — O24419 Gestational diabetes mellitus in pregnancy, unspecified control: Secondary | ICD-10-CM

## 2016-01-20 DIAGNOSIS — Z23 Encounter for immunization: Secondary | ICD-10-CM | POA: Diagnosis not present

## 2016-01-20 DIAGNOSIS — O24919 Unspecified diabetes mellitus in pregnancy, unspecified trimester: Secondary | ICD-10-CM

## 2016-01-20 DIAGNOSIS — O359XX0 Maternal care for (suspected) fetal abnormality and damage, unspecified, not applicable or unspecified: Secondary | ICD-10-CM

## 2016-01-20 DIAGNOSIS — E119 Type 2 diabetes mellitus without complications: Secondary | ICD-10-CM

## 2016-01-20 DIAGNOSIS — O0992 Supervision of high risk pregnancy, unspecified, second trimester: Secondary | ICD-10-CM

## 2016-01-20 MED ORDER — GLYBURIDE 5 MG PO TABS: 10.0000 mg | ORAL_TABLET | Freq: Two times a day (BID) | ORAL | 3 refills | Status: DC

## 2016-01-20 NOTE — Progress Notes (Signed)
Subjective:  Allison Hess is a 35 y.o. G2P0010 at 1066w1d being seen today for ongoing prenatal care.  She is currently monitored for the following issues for this high-risk pregnancy and has Controlled type 2 diabetes mellitus without complication, without long-term current use of insulin (HCC); Supervision of high risk pregnancy, antepartum; Gestational diabetes mellitus (GDM) affecting pregnancy, antepartum (HCC); Known or suspected fetal anomaly affecting care of mother, antepartum; and Fetal abnormality affecting management of mother, antepartum on her problem list.  GDM: Patient taking Metformin 1000mg  BID, Glyburide 7.5mg  BID.  Reports no hypoglycemic episodes.  Tolerating medication well Fasting: 74-112 (9 elevated) 2hr PP: 77-156 (20 elevated)  Patient reports no complaints.  Contractions: Not present. Vag. Bleeding: None.  Movement: Present. Denies leaking of fluid.   The following portions of the patient's history were reviewed and updated as appropriate: allergies, current medications, past family history, past medical history, past social history, past surgical history and problem list. Problem list updated.  Objective:   Vitals:   01/20/16 1052  BP: (!) 117/57  Pulse: 88  Weight: 205 lb (93 kg)    Fetal Status: Fetal Heart Rate (bpm): 150 Fundal Height: 27 cm Movement: Present     General:  Alert, oriented and cooperative. Patient is in no acute distress.  Skin: Skin is warm and dry. No rash noted.   Cardiovascular: Normal heart rate noted  Respiratory: Normal respiratory effort, no problems with respiration noted  Abdomen: Soft, gravid, appropriate for gestational age. Pain/Pressure: Absent     Pelvic: Vag. Bleeding: None Vag D/C Character: Thin   Cervical exam deferred        Extremities: Normal range of motion.  Edema: None  Mental Status: Normal mood and affect. Normal behavior. Normal judgment and thought content.   Urinalysis: Urine Protein: Negative Urine Glucose:  Negative  Assessment and Plan:  Pregnancy: G2P0010 at 3066w1d  1. Supervision of high risk pregnancy, antepartum, second trimester FHT and FH normal.  2. Controlled type 2 diabetes mellitus without complication, without long-term current use of insulin (HCC) Fetal echo scheduled for next month. Has growth US next week.  Increase Glyburide to 10mg  BID.  Discussed that may need to transition to insulin.  3. Fetal abnormality affecting management of mother, antepartum, not applicable or unspecified fetus F/u US next week.  Preterm labor symptoms and general obstetric precautions including but not limited to vaginal bleeding, contractions, leaking of fluid and fetal movement were reviewed in detail with the patient. Please refer to After Visit Summary for other counseling recommendations.  Return in about 2 weeks (around 02/03/2016) for OB f/u.   Levie HeritageJacob J Stinson, DO

## 2016-01-26 ENCOUNTER — Encounter (HOSPITAL_COMMUNITY): Payer: Self-pay

## 2016-01-26 ENCOUNTER — Ambulatory Visit (HOSPITAL_COMMUNITY)
Admission: RE | Admit: 2016-01-26 | Discharge: 2016-01-26 | Disposition: A | Discharge status: Home / Self Care | Payer: Managed Care, Other (non HMO) | Source: Ambulatory Visit | Attending: Obstetrics & Gynecology | Admitting: Obstetrics & Gynecology

## 2016-01-26 ENCOUNTER — Other Ambulatory Visit (HOSPITAL_COMMUNITY): Payer: Self-pay | Admitting: Obstetrics and Gynecology

## 2016-01-26 VITALS — BP 117/68 | HR 97 | Wt 205.2 lb

## 2016-01-26 DIAGNOSIS — Z3A26 26 weeks gestation of pregnancy: Secondary | ICD-10-CM | POA: Diagnosis not present

## 2016-01-26 DIAGNOSIS — O359XX0 Maternal care for (suspected) fetal abnormality and damage, unspecified, not applicable or unspecified: Secondary | ICD-10-CM

## 2016-01-26 DIAGNOSIS — O24912 Unspecified diabetes mellitus in pregnancy, second trimester: Secondary | ICD-10-CM

## 2016-01-26 DIAGNOSIS — O35EXX0 Maternal care for other (suspected) fetal abnormality and damage, fetal genitourinary anomalies, not applicable or unspecified: Secondary | ICD-10-CM

## 2016-01-26 DIAGNOSIS — O24312 Unspecified pre-existing diabetes mellitus in pregnancy, second trimester: Secondary | ICD-10-CM | POA: Diagnosis not present

## 2016-01-26 DIAGNOSIS — O283 Abnormal ultrasonic finding on antenatal screening of mother: Secondary | ICD-10-CM

## 2016-01-26 DIAGNOSIS — O358XX0 Maternal care for other (suspected) fetal abnormality and damage, not applicable or unspecified: Secondary | ICD-10-CM | POA: Diagnosis not present

## 2016-01-26 DIAGNOSIS — Z362 Encounter for other antenatal screening follow-up: Secondary | ICD-10-CM

## 2016-02-02 ENCOUNTER — Encounter: Payer: Self-pay | Admitting: Obstetrics & Gynecology

## 2016-02-03 ENCOUNTER — Ambulatory Visit (INDEPENDENT_AMBULATORY_CARE_PROVIDER_SITE_OTHER): Payer: Managed Care, Other (non HMO) | Admitting: Family Medicine

## 2016-02-03 ENCOUNTER — Encounter: Payer: Self-pay | Admitting: Family Medicine

## 2016-02-03 VITALS — BP 114/72 | HR 91 | Wt 208.0 lb

## 2016-02-03 DIAGNOSIS — O359XX0 Maternal care for (suspected) fetal abnormality and damage, unspecified, not applicable or unspecified: Secondary | ICD-10-CM

## 2016-02-03 DIAGNOSIS — O24419 Gestational diabetes mellitus in pregnancy, unspecified control: Secondary | ICD-10-CM

## 2016-02-03 DIAGNOSIS — Z23 Encounter for immunization: Secondary | ICD-10-CM

## 2016-02-03 DIAGNOSIS — O0992 Supervision of high risk pregnancy, unspecified, second trimester: Secondary | ICD-10-CM

## 2016-02-03 DIAGNOSIS — E119 Type 2 diabetes mellitus without complications: Secondary | ICD-10-CM

## 2016-02-03 DIAGNOSIS — O0990 Supervision of high risk pregnancy, unspecified, unspecified trimester: Secondary | ICD-10-CM

## 2016-02-03 MED ORDER — INSULIN NPH (HUMAN) (ISOPHANE) 100 UNIT/ML ~~LOC~~ SUSP: 10.0000 [IU] | Freq: Two times a day (BID) | SUBCUTANEOUS | 3 refills | Status: DC

## 2016-02-03 MED ORDER — INSULIN ASPART 100 UNIT/ML ~~LOC~~ SOLN: 5.0000 [IU] | Freq: Three times a day (TID) | SUBCUTANEOUS | 12 refills | Status: DC

## 2016-02-03 MED ORDER — TETANUS-DIPHTH-ACELL PERTUSSIS 5-2.5-18.5 LF-MCG/0.5 IM SUSP
0.5000 mL | Freq: Once | INTRAMUSCULAR | Status: AC
  Administered 2016-02-03: 0.5 mL via INTRAMUSCULAR

## 2016-02-03 MED ORDER — "INSULIN SYRINGE-NEEDLE U-100 31G X 15/64"" 0.5 ML MISC": 1.0000 | Freq: Every day | 12 refills | Status: DC

## 2016-02-03 NOTE — Progress Notes (Signed)
Subjective:  Allison Hess is a 35 y.o. G2P0010 at 1037w1d being seen today for ongoing prenatal care.  She is currently monitored for the following issues for this high-risk pregnancy and has Controlled type 2 diabetes mellitus without complication, without long-term current use of insulin (HCC); Supervision of high risk pregnancy, antepartum; Gestational diabetes mellitus (GDM) affecting pregnancy, antepartum; Known or suspected fetal anomaly affecting care of mother, antepartum; and Fetal abnormality affecting management of mother, antepartum on her problem list.  GDM: Patient taking Glyburide and Metformin.  Reports occasional hypoglycemic episodes.  Tolerating medication well Fasting: 63-116 (half elevated) 2hr PP: 56- 148 (most elevated)  Patient reports no complaints.  Contractions: Not present. Vag. Bleeding: None.  Movement: Present. Denies leaking of fluid.   The following portions of the patient's history were reviewed and updated as appropriate: allergies, current medications, past family history, past medical history, past social history, past surgical history and problem list. Problem list updated.  Objective:   Vitals:   02/03/16 1106  BP: 114/72  Pulse: 91  Weight: 208 lb (94.3 kg)    Fetal Status: Fetal Heart Rate (bpm): 142 Fundal Height: 30 cm Movement: Present     General:  Alert, oriented and cooperative. Patient is in no acute distress.  Skin: Skin is warm and dry. No rash noted.   Cardiovascular: Normal heart rate noted  Respiratory: Normal respiratory effort, no problems with respiration noted  Abdomen: Soft, gravid, appropriate for gestational age. Pain/Pressure: Absent     Pelvic: Vag. Bleeding: None Vag D/C Character: Thin   Cervical exam deferred        Extremities: Normal range of motion.  Edema: None  Mental Status: Normal mood and affect. Normal behavior. Normal judgment and thought content.   Urinalysis:      Assessment and Plan:  Pregnancy: G2P0010 at  10237w1d  1. Supervision of high risk pregnancy, antepartum, second trimester FHT and FH normal  2. Controlled type 2 diabetes mellitus without complication, without long-term current use of insulin (HCC) Will change to insulin as too many of her blood sugars are elevated. Stop glyburide, continue metformin. NPH 20units at breakfast, 10 units at bedtime Aspart 5 units before meals. EFW 75% (AC 95%) US in 4 weeks.  3. Fetal abnormality affecting management of mother, antepartum, not applicable or unspecified fetus Has appt with Peds Urology in 2 weeks  Preterm labor symptoms and general obstetric precautions including but not limited to vaginal bleeding, contractions, leaking of fluid and fetal movement were reviewed in detail with the patient. Please refer to After Visit Summary for other counseling recommendations.  Return in about 1 week (around 02/10/2016) for OB f/u, DM management.   Levie HeritageJacob J Stinson, DO

## 2016-02-03 NOTE — Addendum Note (Signed)
Addended by: Granville LewisLARK, Markia Kyer L on: 02/03/2016 12:03 PM   Modules accepted: Orders

## 2016-02-04 ENCOUNTER — Encounter: Payer: Self-pay | Admitting: Family Medicine

## 2016-02-04 LAB — CBC
HEMATOCRIT: 34.2 % — AB (ref 35.0–45.0)
HEMOGLOBIN: 10.8 g/dL — AB (ref 11.7–15.5)
MCH: 25.9 pg — AB (ref 27.0–33.0)
MCHC: 31.6 g/dL — ABNORMAL LOW (ref 32.0–36.0)
MCV: 82 fL (ref 80.0–100.0)
MPV: 10.5 fL (ref 7.5–12.5)
Platelets: 358 10*3/uL (ref 140–400)
RBC: 4.17 MIL/uL (ref 3.80–5.10)
RDW: 15.2 % — ABNORMAL HIGH (ref 11.0–15.0)
WBC: 11.1 10*3/uL — ABNORMAL HIGH (ref 3.8–10.8)

## 2016-02-04 LAB — RPR

## 2016-02-04 LAB — HIV ANTIBODY (ROUTINE TESTING W REFLEX): HIV 1&2 Ab, 4th Generation: NONREACTIVE

## 2016-02-10 ENCOUNTER — Ambulatory Visit (INDEPENDENT_AMBULATORY_CARE_PROVIDER_SITE_OTHER): Payer: Managed Care, Other (non HMO) | Admitting: Family Medicine

## 2016-02-10 VITALS — BP 106/68 | HR 87 | Wt 205.0 lb

## 2016-02-10 DIAGNOSIS — O24113 Pre-existing diabetes mellitus, type 2, in pregnancy, third trimester: Secondary | ICD-10-CM

## 2016-02-10 DIAGNOSIS — O359XX0 Maternal care for (suspected) fetal abnormality and damage, unspecified, not applicable or unspecified: Secondary | ICD-10-CM

## 2016-02-10 DIAGNOSIS — E119 Type 2 diabetes mellitus without complications: Secondary | ICD-10-CM

## 2016-02-10 DIAGNOSIS — O0992 Supervision of high risk pregnancy, unspecified, second trimester: Secondary | ICD-10-CM

## 2016-02-10 MED ORDER — INSULIN NPH (HUMAN) (ISOPHANE) 100 UNIT/ML ~~LOC~~ SUSP: 10.0000 [IU] | Freq: Two times a day (BID) | SUBCUTANEOUS | 3 refills | Status: DC

## 2016-02-10 NOTE — Progress Notes (Signed)
   PRENATAL VISIT NOTE  Subjective:  Quintella Reichertaula Mcelreath is a 35 y.o. G2P0010 at 265w1d being seen today for ongoing prenatal care.  She is currently monitored for the following issues for this high-risk pregnancy and has Controlled type 2 diabetes mellitus without complication, without long-term current use of insulin (HCC); Supervision of high risk pregnancy, antepartum; Gestational diabetes mellitus (GDM) affecting pregnancy, antepartum; Known or suspected fetal anomaly affecting care of mother, antepartum; and Fetal abnormality affecting management of mother, antepartum on her problem list.  GDM: Patient taking metformin and insulin. Currently taking NPH 20 units in AM and 10 units in PM, Aspart 5 units premeal.  Reports no hypoglycemic episodes.  Tolerating medication well Has been on insulin for 5 days. Fasting: 90s 2hr PP: after breakfast, controlled. After Lunch and dinner are 120s-130s mostly.   Patient reports no complaints.  Contractions: Not present. Vag. Bleeding: None.  Movement: Present. Denies leaking of fluid.   The following portions of the patient's history were reviewed and updated as appropriate: allergies, current medications, past family history, past medical history, past social history, past surgical history and problem list. Problem list updated.  Objective:   Vitals:   02/10/16 1108  BP: 106/68  Pulse: 87  Weight: 205 lb (93 kg)    Fetal Status: Fetal Heart Rate (bpm): 148   Movement: Present     General:  Alert, oriented and cooperative. Patient is in no acute distress.  Skin: Skin is warm and dry. No rash noted.   Cardiovascular: Normal heart rate noted  Respiratory: Normal respiratory effort, no problems with respiration noted  Abdomen: Soft, gravid, appropriate for gestational age. Pain/Pressure: Absent     Pelvic:  Cervical exam deferred        Extremities: Normal range of motion.  Edema: None  Mental Status: Normal mood and affect. Normal behavior. Normal  judgment and thought content.   Assessment and Plan:  Pregnancy: G2P0010 at 1965w1d  1. Supervision of high risk pregnancy, antepartum, second trimester FHT normal. FH measuring 4 weeks ahead. US on 11/1 for growth.  2. Controlled type 2 diabetes mellitus without complication, without long-term current use of insulin (HCC) Continue metformin. Increase NPH to 22 units in AM and 12 units in PM. Continue premeal. Pt to send copy of CBGs next week. F/u in 2 weeks.  3. Fetal abnormality affecting management of mother, antepartum, not applicable or unspecified fetus Has pediatric urology appt next week.  Preterm labor symptoms and general obstetric precautions including but not limited to vaginal bleeding, contractions, leaking of fluid and fetal movement were reviewed in detail with the patient. Please refer to After Visit Summary for other counseling recommendations.  Return in about 2 weeks (around 02/24/2016) for OB f/u.  Levie HeritageJacob J Treyvonne Tata, DO

## 2016-02-15 ENCOUNTER — Encounter: Payer: Self-pay | Admitting: Family Medicine

## 2016-02-23 ENCOUNTER — Ambulatory Visit (INDEPENDENT_AMBULATORY_CARE_PROVIDER_SITE_OTHER): Payer: Managed Care, Other (non HMO) | Admitting: Family Medicine

## 2016-02-23 ENCOUNTER — Ambulatory Visit (HOSPITAL_COMMUNITY)
Admission: RE | Admit: 2016-02-23 | Discharge: 2016-02-23 | Disposition: A | Discharge status: Home / Self Care | Payer: Managed Care, Other (non HMO) | Source: Ambulatory Visit | Attending: Obstetrics & Gynecology | Admitting: Obstetrics & Gynecology

## 2016-02-23 ENCOUNTER — Encounter (HOSPITAL_COMMUNITY): Payer: Self-pay

## 2016-02-23 VITALS — BP 124/72 | HR 84 | Wt 208.0 lb

## 2016-02-23 DIAGNOSIS — Z3A30 30 weeks gestation of pregnancy: Secondary | ICD-10-CM | POA: Diagnosis not present

## 2016-02-23 DIAGNOSIS — O358XX0 Maternal care for other (suspected) fetal abnormality and damage, not applicable or unspecified: Secondary | ICD-10-CM | POA: Insufficient documentation

## 2016-02-23 DIAGNOSIS — O24313 Unspecified pre-existing diabetes mellitus in pregnancy, third trimester: Secondary | ICD-10-CM | POA: Diagnosis not present

## 2016-02-23 DIAGNOSIS — O0990 Supervision of high risk pregnancy, unspecified, unspecified trimester: Secondary | ICD-10-CM

## 2016-02-23 DIAGNOSIS — O24113 Pre-existing diabetes mellitus, type 2, in pregnancy, third trimester: Secondary | ICD-10-CM

## 2016-02-23 DIAGNOSIS — O359XX0 Maternal care for (suspected) fetal abnormality and damage, unspecified, not applicable or unspecified: Secondary | ICD-10-CM

## 2016-02-23 DIAGNOSIS — E119 Type 2 diabetes mellitus without complications: Secondary | ICD-10-CM

## 2016-02-23 MED ORDER — INSULIN NPH (HUMAN) (ISOPHANE) 100 UNIT/ML ~~LOC~~ SUSP: 15.0000 [IU] | Freq: Two times a day (BID) | SUBCUTANEOUS | 3 refills | Status: DC

## 2016-02-23 NOTE — Progress Notes (Signed)
Subjective:  Quintella Reichertaula Newgent is a [age] y.o. G2P0010 at 6113w0d being seen today for ongoing prenatal care.  She is currently monitored for the following issues for this high-risk pregnancy and has Controlled type 2 diabetes mellitus without complication, without long-term current use of insulin (HCC); Supervision of high risk pregnancy, antepartum; Gestational diabetes mellitus (GDM) affecting pregnancy, antepartum; Known or suspected fetal anomaly affecting care of mother, antepartum; and Fetal abnormality affecting management of mother, antepartum on her problem list.  GDM: Patient taking metformin 1000mg  BID, insulin NPH 22 in AM and 12 in PM, aspart 5 units TID.  Reports no hypoglycemic episodes.  Tolerating medication well  Fasting: 90s-103 2hr PP: elevated  Patient reports no complaints.  Contractions: Not present. Vag. Bleeding: None.  Movement: Present. Denies leaking of fluid.   The following portions of the patient's history were reviewed and updated as appropriate: allergies, current medications, past family history, past medical history, past social history, past surgical history and problem list. Problem list updated.  Objective:   Vitals:   02/23/16 1305  BP: 124/72  Pulse: 84  Weight: 208 lb (94.3 kg)    Fetal Status:     Movement: Present     General:  Alert, oriented and cooperative. Patient is in no acute distress.  Skin: Skin is warm and dry. No rash noted.   Cardiovascular: Normal heart rate noted  Respiratory: Normal respiratory effort, no problems with respiration noted  Abdomen: Soft, gravid, appropriate for gestational age. Pain/Pressure: Absent     Pelvic: Vag. Bleeding: None     Cervical exam deferred        Extremities: Normal range of motion.  Edema: None  Mental Status: Normal mood and affect. Normal behavior. Normal judgment and thought content.   Urinalysis:      Assessment and Plan:  Pregnancy: G2P0010 at 2713w0d  1. Supervision of high risk pregnancy,  antepartum US today.  2. Controlled type 2 diabetes mellitus without complication, without long-term current use of insulin (HCC) Increase to 25 in AM and 15 in PM  3. Fetal abnormality affecting management of mother, antepartum, single or unspecified fetus Saw urologist. Get US after delivery.  Preterm labor symptoms and general obstetric precautions including but not limited to vaginal bleeding, contractions, leaking of fluid and fetal movement were reviewed in detail with the patient. Please refer to After Visit Summary for other counseling recommendations.  No Follow-up on file.   Levie HeritageJacob J Stinson, DO

## 2016-02-25 ENCOUNTER — Other Ambulatory Visit (HOSPITAL_COMMUNITY): Payer: Self-pay | Admitting: *Deleted

## 2016-02-25 DIAGNOSIS — IMO0002 Reserved for concepts with insufficient information to code with codable children: Secondary | ICD-10-CM

## 2016-02-28 ENCOUNTER — Encounter: Payer: Self-pay | Admitting: Family Medicine

## 2016-03-09 ENCOUNTER — Encounter: Payer: Self-pay | Admitting: Family Medicine

## 2016-03-09 ENCOUNTER — Ambulatory Visit (INDEPENDENT_AMBULATORY_CARE_PROVIDER_SITE_OTHER): Payer: Managed Care, Other (non HMO) | Admitting: Family Medicine

## 2016-03-09 VITALS — BP 112/76 | HR 88 | Wt 207.0 lb

## 2016-03-09 DIAGNOSIS — M9905 Segmental and somatic dysfunction of pelvic region: Secondary | ICD-10-CM

## 2016-03-09 DIAGNOSIS — M9904 Segmental and somatic dysfunction of sacral region: Secondary | ICD-10-CM

## 2016-03-09 DIAGNOSIS — O0990 Supervision of high risk pregnancy, unspecified, unspecified trimester: Secondary | ICD-10-CM

## 2016-03-09 DIAGNOSIS — O9989 Other specified diseases and conditions complicating pregnancy, childbirth and the puerperium: Secondary | ICD-10-CM | POA: Diagnosis not present

## 2016-03-09 DIAGNOSIS — M9903 Segmental and somatic dysfunction of lumbar region: Secondary | ICD-10-CM

## 2016-03-09 DIAGNOSIS — O24419 Gestational diabetes mellitus in pregnancy, unspecified control: Secondary | ICD-10-CM

## 2016-03-09 DIAGNOSIS — E119 Type 2 diabetes mellitus without complications: Secondary | ICD-10-CM

## 2016-03-09 MED ORDER — INSULIN NPH (HUMAN) (ISOPHANE) 100 UNIT/ML ~~LOC~~ SUSP: 15.0000 [IU] | Freq: Two times a day (BID) | SUBCUTANEOUS | 3 refills | Status: DC

## 2016-03-09 NOTE — Progress Notes (Signed)
Subjective:  Allison Hess is a 35 y.o. G2P0010 at 8856w1d being seen today for ongoing prenatal care.  She is currently monitored for the following issues for this high-risk pregnancy and has Controlled type 2 diabetes mellitus without complication, without long-term current use of insulin (HCC); Supervision of high risk pregnancy, antepartum; Gestational diabetes mellitus (GDM) affecting pregnancy, antepartum; Known or suspected fetal anomaly affecting care of mother, antepartum; and Fetal abnormality affecting management of mother, antepartum on her problem list.  GDM: Patient taking metformin and insulin.  Reports no hypoglycemic episodes.  Tolerating medication well Fasting: 87-107, most elevated 2hr PP: 88-150, most around 110-120  Patient reports right hip pain.  Contractions: Irritability. Vag. Bleeding: None.  Movement: Present. Denies leaking of fluid.   The following portions of the patient's history were reviewed and updated as appropriate: allergies, current medications, past family history, past medical history, past social history, past surgical history and problem list. Problem list updated.  Objective:   Vitals:   03/09/16 0806  BP: 112/76  Pulse: 88  Weight: 207 lb (93.9 kg)    Fetal Status: Fetal Heart Rate (bpm): 140   Movement: Present     General:  Alert, oriented and cooperative. Patient is in no acute distress.  Skin: Skin is warm and dry. No rash noted.   Cardiovascular: Normal heart rate noted  Respiratory: Normal respiratory effort, no problems with respiration noted  Abdomen: Soft, gravid, appropriate for gestational age. Pain/Pressure: Present     Pelvic: Vag. Bleeding: None Vag D/C Character: Thin   Cervical exam deferred        Extremities: Normal range of motion.  Edema: None  MSK: Tenderness in right lower back and right hip  OSE: R/R torsion, right ant innom, L5 ESRR  Mental Status: Normal mood and affect. Normal behavior. Normal judgment and thought  content.   Urinalysis: Urine Protein: Negative Urine Glucose: Negative  Assessment and Plan:  Pregnancy: G2P0010 at 4356w1d  1. Supervision of high risk pregnancy, antepartum FHT and FH normal  2. Controlled type 2 diabetes mellitus without complication, without long-term current use of insulin (HCC) Increase bedtime NPH to 18 u. Last US: EFW 3#8oz (64%) Start twice weekly testing NST - reactive  3. Somatic dysfunction of lumbar region 4. Somatic dysfunction of sacral region 5. Somatic dysfunction of pelvis region OMT done. Pt tolerated well. HVLA used.  Preterm labor symptoms and general obstetric precautions including but not limited to vaginal bleeding, contractions, leaking of fluid and fetal movement were reviewed in detail with the patient. Please refer to After Visit Summary for other counseling recommendations.  No Follow-up on file.   Levie HeritageJacob J Keghan Mcfarren, DO

## 2016-03-13 ENCOUNTER — Ambulatory Visit (INDEPENDENT_AMBULATORY_CARE_PROVIDER_SITE_OTHER): Payer: Managed Care, Other (non HMO)

## 2016-03-13 DIAGNOSIS — E119 Type 2 diabetes mellitus without complications: Secondary | ICD-10-CM

## 2016-03-13 DIAGNOSIS — O24419 Gestational diabetes mellitus in pregnancy, unspecified control: Secondary | ICD-10-CM

## 2016-03-13 DIAGNOSIS — O24919 Unspecified diabetes mellitus in pregnancy, unspecified trimester: Secondary | ICD-10-CM

## 2016-03-15 ENCOUNTER — Ambulatory Visit (INDEPENDENT_AMBULATORY_CARE_PROVIDER_SITE_OTHER): Payer: Managed Care, Other (non HMO)

## 2016-03-15 ENCOUNTER — Inpatient Hospital Stay (HOSPITAL_COMMUNITY)
Admission: AD | Admit: 2016-03-15 | Discharge: 2016-03-15 | Disposition: A | Discharge status: Home / Self Care | Payer: Managed Care, Other (non HMO) | Source: Ambulatory Visit | Attending: Family Medicine | Admitting: Family Medicine

## 2016-03-15 ENCOUNTER — Encounter (HOSPITAL_COMMUNITY): Payer: Self-pay

## 2016-03-15 DIAGNOSIS — Z87891 Personal history of nicotine dependence: Secondary | ICD-10-CM | POA: Insufficient documentation

## 2016-03-15 DIAGNOSIS — Z3689 Encounter for other specified antenatal screening: Secondary | ICD-10-CM

## 2016-03-15 DIAGNOSIS — Z7984 Long term (current) use of oral hypoglycemic drugs: Secondary | ICD-10-CM

## 2016-03-15 DIAGNOSIS — Z3A33 33 weeks gestation of pregnancy: Secondary | ICD-10-CM | POA: Insufficient documentation

## 2016-03-15 DIAGNOSIS — O24113 Pre-existing diabetes mellitus, type 2, in pregnancy, third trimester: Secondary | ICD-10-CM | POA: Insufficient documentation

## 2016-03-15 DIAGNOSIS — E119 Type 2 diabetes mellitus without complications: Secondary | ICD-10-CM | POA: Diagnosis not present

## 2016-03-15 DIAGNOSIS — Z794 Long term (current) use of insulin: Secondary | ICD-10-CM | POA: Insufficient documentation

## 2016-03-15 DIAGNOSIS — O0990 Supervision of high risk pregnancy, unspecified, unspecified trimester: Secondary | ICD-10-CM

## 2016-03-15 NOTE — MAU Note (Signed)
"  the heartrate dipped" during NST, so sent in.  Getting NST's due to diabetis.  Pt has no complaints

## 2016-03-15 NOTE — MAU Note (Signed)
Urine in lab 

## 2016-03-15 NOTE — Progress Notes (Signed)
NST reviewed by Dr. Adrian BlackwaterStinson and [facility] Armandina StammerJennifer Kalel Harty RNBSN

## 2016-03-15 NOTE — MAU Provider Note (Signed)
History     CSN: [id number]  Arrival date and time: 03/15/16 1446   First Provider Initiated Contact with Patient 03/15/16 1502      Chief Complaint  Patient presents with  . non-reactive NST   HPI Ms. Quintella Reichertaula Joyner is a 35 y.o. G2P0010 at 7270w0d who presents to MAU today from the [facility] after fetal heart rate deceleration was noted during routine NST in the office today. She has DM type 2 and is on insulin and metformin. She denies vaginal bleeding, LOF or contractions. No other complications with the pregnancy. She reports good fetal movement.    OB History    Gravida Para Term Preterm AB Living   2       1 0   SAB TAB Ectopic Multiple Live Births   1              Past Medical History:  Diagnosis Date  . Diabetes mellitus without complication (HCC)     History reviewed. No pertinent surgical history.  Family History  Problem Relation Age of Onset  . Diabetes Mother   . Hypertension Mother   . Diabetes Father   . Hypertension Father   . Cancer Maternal Grandmother     breast    Social History  Substance Use Topics  . Smoking status: Former Smoker    Packs/day: 1.00    Years: 5.00    Types: Cigarettes    Quit date: 09/22/2005  . Smokeless tobacco: Never Used  . Alcohol use No    Allergies:  Allergies  Allergen Reactions  . Erythromycin Rash  . Amoxicillin   . Penicillins     Prescriptions Prior to Admission  Medication Sig Dispense Refill Last Dose  . BAYER CONTOUR TEST test strip Reported on 09/29/2015   Taking  . glyBURIDE (DIABETA) 5 MG tablet    Taking  . insulin aspart (NOVOLOG) 100 UNIT/ML injection Inject 5 Units into the skin 3 (three) times daily before meals. 10 mL 12 Taking  . insulin NPH Human (HUMULIN N,NOVOLIN N) 100 UNIT/ML injection Inject 0.15-0.25 mLs (15-25 Units total) into the skin 2 (two) times daily. 25 units before breakfast, 18 units at bedtime 10 mL 3   . Insulin Syringe-Needle U-100 31G X 15/64" 0.5 ML MISC 1 Syringe by Does  not apply route 5 (five) times daily. For DM2 E11.9. 100 each 12 Taking  . metFORMIN (GLUCOPHAGE) 1000 MG tablet Take 1 tablet (1,000 mg total) by mouth 2 (two) times daily with a meal. 60 tablet 3 Taking  . Prenatal Vit-Fe Fumarate-FA (PRENATAL MULTIVITAMIN) TABS tablet Take 1 tablet by mouth daily at 12 noon. Plus DHA 200 mg   Taking  . vitamin E 400 UNIT capsule Take 400 Units by mouth daily. Reported on 11/12/2015   Taking    Review of Systems  Constitutional: Negative for fever.  Gastrointestinal: Negative for abdominal pain.  Genitourinary:       Neg - vaginal bleeding, discharge, LOF   Physical Exam   Last menstrual period 07/28/2015.  Physical Exam  Nursing note and vitals reviewed. Constitutional: She is oriented to person, place, and time. She appears well-developed and well-nourished. No distress.  HENT:  Head: Normocephalic and atraumatic.  Cardiovascular: Normal rate.   Respiratory: Effort normal.  GI: Soft. She exhibits no distension and no mass. There is no tenderness. There is no rebound and no guarding.  Neurological: She is alert and oriented to person, place, and time.  Skin: Skin is warm  and dry. No erythema.  Psychiatric: She has a normal mood and affect.    Fetal Monitoring: Baseline: 125 bpm Variability: moderate Accelerations: 15 x 15 Decelerations: none Contractions: none   MAU Course  Procedures None  MDM NST today - reactive  Assessment and Plan  A: [facility]UP at 59108w0d  P: Discharge home Preterm labor precautions and kick counts discussed Patient advised to follow-up with [facility] as scheduled for routine prenatal care and antenatal testing Patient may return to MAU as needed or if her condition were to change or worsen   Marny LowensteinJulie N Farid Grigorian, PA-C  03/15/2016, 3:03 PM

## 2016-03-15 NOTE — Discharge Instructions (Signed)
Introduction Patient Name: ________________________________________________ Patient Due Date: ____________________ What is a fetal movement count? A fetal movement count is the number of times that you feel your baby move during a certain amount of time. This may also be called a fetal kick count. A fetal movement count is recommended for every pregnant woman. You may be asked to start counting fetal movements as early as week 28 of your pregnancy. Pay attention to when your baby is most active. You may notice your baby's sleep and wake cycles. You may also notice things that make your baby move more. You should do a fetal movement count:  When your baby is normally most active.  At the same time each day. A good time to count movements is while you are resting, after having something to eat and drink. How do I count fetal movements? 1. Find a quiet, comfortable area. Sit, or lie down on your side. 2. Write down the date, the start time and stop time, and the number of movements that you felt between those two times. Take this information with you to your health care visits. 3. For 2 hours, count kicks, flutters, swishes, rolls, and jabs. You should feel at least 10 movements during 2 hours. 4. You may stop counting after you have felt 10 movements. 5. If you do not feel 10 movements in 2 hours, have something to eat and drink. Then, keep resting and counting for 1 hour. If you feel at least 4 movements during that hour, you may stop counting. Contact a health care provider if:  You feel fewer than 4 movements in 2 hours.  Your baby is not moving like he or she usually does. Date: ____________ Start time: ____________ Stop time: ____________ Movements: ____________ Date: ____________ Start time: ____________ Stop time: ____________ Movements: ____________ Date: ____________ Start time: ____________ Stop time: ____________ Movements: ____________ Date: ____________ Start time: ____________  Stop time: ____________ Movements: ____________ Date: ____________ Start time: ____________ Stop time: ____________ Movements: ____________ Date: ____________ Start time: ____________ Stop time: ____________ Movements: ____________ Date: ____________ Start time: ____________ Stop time: ____________ Movements: ____________ Date: ____________ Start time: ____________ Stop time: ____________ Movements: ____________ Date: ____________ Start time: ____________ Stop time: ____________ Movements: ____________ This information is not intended to replace advice given to you by your health care provider. Make sure you discuss any questions you have with your health care provider. Document Released: 05/10/2006 Document Revised: 12/08/2015 Document Reviewed: 05/20/2015 Elsevier Interactive Patient Education  2017 [employer]. Fetal Monitoring Overview Monitoring your developing baby (fetus) before birth can identify potential problems for you and your baby. Fetal monitoring can:  Help prevent serious problems from developing.  Guide health care providers in how best to care for your unborn baby.  Check your unborn babys overall health. The amount of monitoring and the specific tests that will be done will vary. It will depend on whether your pregnancy is considered to be low or high risk. For example, you may need certain tests if you have a medical problem that can put your unborn baby at risk. Health care providers use several techniques and tests to monitor your baby before birth. No single test is perfectly accurate, and you may need to follow up with your health care provider if there are any concerns. Fetal movement counts When you are past 20 weeks of pregnancy, you may feel your baby move. As your baby gets bigger, these movements are easier to feel. A fetal movement count is when you  count the number of movements (kicks, flutters, swishes, rolls, or jabs) over a specific period of time. You  record the number and report it to your health care provider. This is often recommended in high-risk pregnancies, but it is good for every pregnant woman to do. Your health care provider may ask you to start counting fetal movements at 28 weeks of pregnancy. Non-stress test The non-stress test:  Evaluates fetal heartbeat:  While your baby is at rest.  While your baby is moving.  Is often done as part of a set of tests called the biophysical profile.  May show signs that it is time for your baby to be delivered. The heart rate in a healthy baby will speed up when the baby moves or kicks. The heart rate will decrease at rest, and the peaks or accelerations of the heart rate will be lower. If the test brings up any questions or concerns, you may need to have more testing. This test may be done if your pregnancy goes past your due date. It is also commonly done in high-risk pregnancies beginning at 32 weeks. Biophysical profile The biophysical profile is a set of tests that are done to find out how well the baby is doing. It includes the non-stress test along with imaging tests that use sound waves (ultrasound) to create images of your baby. In a biophysical profile, the following tests are done and scored:  Fetal heartbeat.  Fetal breathing.  Fetal movements.  Fetal muscle tone.  Amount of amniotic fluid. Each test gets a score of either 2 (normal) or 0 (abnormal). The scores are then added together for a total score. A low score may mean that you and your baby need additional monitoring or special care. Sometimes your health care provider may recommend that you deliver early. This test is usually done after 32 weeks of pregnancy. It can be done sooner, if needed. Contraction stress test A contraction stress test monitors the heart rate of your baby during contractions. The test checks to see how your baby will tolerate the stress of labor. Health care providers use this test to further  evaluate your baby when other tests, such as the biophysical profile, have shown that there may be a problem. This test may be done:  Any time you are in labor.  Between weeks 32 and 34 of your pregnancy.  Later, if necessary. Modified biophysical profile A modified biophysical profile is a two-part test. You will have:  An ultrasound exam, to check how much fluid is surrounding your baby inside of your womb (amniotic fluid).  A non-stress test, to check your babys heart rate. The results will determine whether a full biophysical profile may be needed. This test is usually done late in your final 3 months (third trimester) of pregnancy. Umbilical artery Doppler velocimetry Umbilical artery Doppler velocimetry uses sound waves to measure the flow of blood between you and your baby. This test:  Measures the amount of blood flow through the cord that attaches your baby to your womb (umbilical cord).  Measures the speed of the blood flow. You likely only need this test to check your baby's condition if you have a high-risk pregnancy. All types of monitoring aim to protect your health and that of your baby. The best way to have a healthy pregnancy and a healthy baby is to learn as much as you can about your pregnancy and to work closely with all your health care providers. This information is not  intended to replace advice given to you by your health care provider. Make sure you discuss any questions you have with your health care provider. Document Released: 03/31/2002 Document Revised: 12/07/2015 Document Reviewed: 11/08/2015 Elsevier Interactive Patient Education  2017 [employer]

## 2016-03-20 ENCOUNTER — Ambulatory Visit (INDEPENDENT_AMBULATORY_CARE_PROVIDER_SITE_OTHER): Payer: Managed Care, Other (non HMO)

## 2016-03-20 ENCOUNTER — Encounter: Payer: Self-pay | Admitting: Family Medicine

## 2016-03-20 VITALS — BP 114/70 | HR 82

## 2016-03-20 DIAGNOSIS — E119 Type 2 diabetes mellitus without complications: Secondary | ICD-10-CM | POA: Diagnosis not present

## 2016-03-20 DIAGNOSIS — O24113 Pre-existing diabetes mellitus, type 2, in pregnancy, third trimester: Secondary | ICD-10-CM | POA: Diagnosis not present

## 2016-03-20 NOTE — Progress Notes (Signed)
Patient presents for NST Elham Fini RN BSN 

## 2016-03-22 ENCOUNTER — Encounter: Payer: Self-pay | Admitting: Family Medicine

## 2016-03-23 ENCOUNTER — Encounter (HOSPITAL_COMMUNITY): Payer: Self-pay

## 2016-03-23 ENCOUNTER — Other Ambulatory Visit (HOSPITAL_COMMUNITY): Payer: Self-pay | Admitting: *Deleted

## 2016-03-23 ENCOUNTER — Ambulatory Visit (HOSPITAL_COMMUNITY)
Admission: RE | Admit: 2016-03-23 | Discharge: 2016-03-23 | Disposition: A | Discharge status: Home / Self Care | Payer: Managed Care, Other (non HMO) | Source: Ambulatory Visit | Attending: Obstetrics & Gynecology | Admitting: Obstetrics & Gynecology

## 2016-03-23 ENCOUNTER — Ambulatory Visit (INDEPENDENT_AMBULATORY_CARE_PROVIDER_SITE_OTHER): Payer: Managed Care, Other (non HMO) | Admitting: Obstetrics & Gynecology

## 2016-03-23 VITALS — BP 127/83 | HR 89 | Wt 207.0 lb

## 2016-03-23 DIAGNOSIS — O24319 Unspecified pre-existing diabetes mellitus in pregnancy, unspecified trimester: Secondary | ICD-10-CM

## 2016-03-23 DIAGNOSIS — E669 Obesity, unspecified: Secondary | ICD-10-CM

## 2016-03-23 DIAGNOSIS — O99210 Obesity complicating pregnancy, unspecified trimester: Secondary | ICD-10-CM

## 2016-03-23 DIAGNOSIS — O283 Abnormal ultrasonic finding on antenatal screening of mother: Secondary | ICD-10-CM | POA: Insufficient documentation

## 2016-03-23 DIAGNOSIS — E119 Type 2 diabetes mellitus without complications: Secondary | ICD-10-CM

## 2016-03-23 DIAGNOSIS — O359XX0 Maternal care for (suspected) fetal abnormality and damage, unspecified, not applicable or unspecified: Secondary | ICD-10-CM

## 2016-03-23 DIAGNOSIS — O358XX0 Maternal care for other (suspected) fetal abnormality and damage, not applicable or unspecified: Secondary | ICD-10-CM | POA: Insufficient documentation

## 2016-03-23 DIAGNOSIS — IMO0002 Reserved for concepts with insufficient information to code with codable children: Secondary | ICD-10-CM

## 2016-03-23 DIAGNOSIS — O24913 Unspecified diabetes mellitus in pregnancy, third trimester: Secondary | ICD-10-CM

## 2016-03-23 DIAGNOSIS — O99213 Obesity complicating pregnancy, third trimester: Secondary | ICD-10-CM | POA: Diagnosis not present

## 2016-03-23 DIAGNOSIS — O0990 Supervision of high risk pregnancy, unspecified, unspecified trimester: Secondary | ICD-10-CM

## 2016-03-23 DIAGNOSIS — Z3A34 34 weeks gestation of pregnancy: Secondary | ICD-10-CM | POA: Insufficient documentation

## 2016-03-23 DIAGNOSIS — O24313 Unspecified pre-existing diabetes mellitus in pregnancy, third trimester: Secondary | ICD-10-CM | POA: Insufficient documentation

## 2016-03-23 DIAGNOSIS — O24419 Gestational diabetes mellitus in pregnancy, unspecified control: Secondary | ICD-10-CM

## 2016-03-23 NOTE — Progress Notes (Signed)
   PRENATAL VISIT NOTE  Subjective:  Allison Hess is a 35 y.o. MAA  G2P0010 at 2611w1d being seen today for ongoing prenatal care.  She is currently monitored for the following issues for this high-risk pregnancy and has Controlled type 2 diabetes mellitus without complication, without long-term current use of insulin (HCC); Supervision of high risk pregnancy, antepartum; Gestational diabetes mellitus (GDM) affecting pregnancy, antepartum; Known or suspected fetal anomaly affecting care of mother, antepartum; Fetal abnormality affecting management of mother, antepartum; and Obesity in pregnancy on her problem list.  Patient reports no complaints.  Contractions: Irritability. Vag. Bleeding: None.  Movement: Present. Denies leaking of fluid.   The following portions of the patient's history were reviewed and updated as appropriate: allergies, current medications, past family history, past medical history, past social history, past surgical history and problem list. Problem list updated.  Objective:   Vitals:   03/23/16 0820  BP: 127/83  Pulse: 89  Weight: 207 lb (93.9 kg)    Fetal Status: Fetal Heart Rate (bpm): NST   Movement: Present     General:  Alert, oriented and cooperative. Patient is in no acute distress.  Skin: Skin is warm and dry. No rash noted.   Cardiovascular: Normal heart rate noted  Respiratory: Normal respiratory effort, no problems with respiration noted  Abdomen: Soft, gravid, appropriate for gestational age. Pain/Pressure: Absent     Pelvic:  Cervical exam deferred        Extremities: Normal range of motion.  Edema: None  Mental Status: Normal mood and affect. Normal behavior. Normal judgment and thought content.   Assessment and Plan:  Pregnancy: G2P0010 at 811w1d  1. Controlled type 2 diabetes mellitus without complication, without long-term current use of insulin (HCC)   2. Fetal abnormality affecting management of mother, antepartum, single or unspecified  fetus   3. Gestational diabetes mellitus (GDM) affecting pregnancy, antepartum - Excellent sugars recorded in her log book - MFM growth u/s and BPP today  4. Supervision of high risk pregnancy, antepartum   5. Obesity in pregnancy   Preterm labor symptoms and general obstetric precautions including but not limited to vaginal bleeding, contractions, leaking of fluid and fetal movement were reviewed in detail with the patient. Please refer to After Visit Summary for other counseling recommendations.  No Follow-up on file.   Allison Hess C Brewster Wolters, MD

## 2016-03-27 ENCOUNTER — Ambulatory Visit (INDEPENDENT_AMBULATORY_CARE_PROVIDER_SITE_OTHER): Payer: Managed Care, Other (non HMO)

## 2016-03-27 ENCOUNTER — Other Ambulatory Visit (HOSPITAL_COMMUNITY): Payer: Self-pay | Admitting: *Deleted

## 2016-03-27 ENCOUNTER — Ambulatory Visit (HOSPITAL_COMMUNITY)
Admission: RE | Admit: 2016-03-27 | Discharge: 2016-03-27 | Disposition: A | Discharge status: Home / Self Care | Payer: Managed Care, Other (non HMO) | Source: Ambulatory Visit | Attending: Obstetrics & Gynecology | Admitting: Obstetrics & Gynecology

## 2016-03-27 DIAGNOSIS — Z794 Long term (current) use of insulin: Principal | ICD-10-CM

## 2016-03-27 DIAGNOSIS — O283 Abnormal ultrasonic finding on antenatal screening of mother: Secondary | ICD-10-CM | POA: Insufficient documentation

## 2016-03-27 DIAGNOSIS — O0993 Supervision of high risk pregnancy, unspecified, third trimester: Secondary | ICD-10-CM | POA: Diagnosis not present

## 2016-03-27 DIAGNOSIS — Z3A34 34 weeks gestation of pregnancy: Secondary | ICD-10-CM | POA: Insufficient documentation

## 2016-03-27 DIAGNOSIS — O358XX0 Maternal care for other (suspected) fetal abnormality and damage, not applicable or unspecified: Secondary | ICD-10-CM | POA: Diagnosis not present

## 2016-03-27 DIAGNOSIS — IMO0001 Reserved for inherently not codable concepts without codable children: Secondary | ICD-10-CM

## 2016-03-27 DIAGNOSIS — O24313 Unspecified pre-existing diabetes mellitus in pregnancy, third trimester: Principal | ICD-10-CM

## 2016-03-27 NOTE — Progress Notes (Signed)
Non reactive NST - [facility] Armandina StammerJennifer Howard RN BSN

## 2016-03-30 ENCOUNTER — Other Ambulatory Visit: Payer: Managed Care, Other (non HMO)

## 2016-03-30 ENCOUNTER — Encounter (HOSPITAL_COMMUNITY): Payer: Self-pay

## 2016-04-03 ENCOUNTER — Other Ambulatory Visit: Payer: Managed Care, Other (non HMO)

## 2016-04-05 ENCOUNTER — Ambulatory Visit (INDEPENDENT_AMBULATORY_CARE_PROVIDER_SITE_OTHER): Payer: Managed Care, Other (non HMO) | Admitting: Family Medicine

## 2016-04-05 ENCOUNTER — Encounter (HOSPITAL_COMMUNITY): Payer: Self-pay

## 2016-04-05 ENCOUNTER — Ambulatory Visit (HOSPITAL_COMMUNITY)
Admission: RE | Admit: 2016-04-05 | Discharge: 2016-04-05 | Disposition: A | Discharge status: Home / Self Care | Payer: Managed Care, Other (non HMO) | Source: Ambulatory Visit | Attending: Obstetrics & Gynecology | Admitting: Obstetrics & Gynecology

## 2016-04-05 VITALS — BP 123/86 | HR 96 | Wt 209.0 lb

## 2016-04-05 DIAGNOSIS — O359XX0 Maternal care for (suspected) fetal abnormality and damage, unspecified, not applicable or unspecified: Secondary | ICD-10-CM

## 2016-04-05 DIAGNOSIS — O24113 Pre-existing diabetes mellitus, type 2, in pregnancy, third trimester: Secondary | ICD-10-CM

## 2016-04-05 DIAGNOSIS — Z113 Encounter for screening for infections with a predominantly sexual mode of transmission: Secondary | ICD-10-CM | POA: Diagnosis not present

## 2016-04-05 DIAGNOSIS — Z3A26 26 weeks gestation of pregnancy: Secondary | ICD-10-CM | POA: Diagnosis not present

## 2016-04-05 DIAGNOSIS — O99210 Obesity complicating pregnancy, unspecified trimester: Secondary | ICD-10-CM

## 2016-04-05 DIAGNOSIS — E119 Type 2 diabetes mellitus without complications: Secondary | ICD-10-CM

## 2016-04-05 DIAGNOSIS — O0993 Supervision of high risk pregnancy, unspecified, third trimester: Secondary | ICD-10-CM

## 2016-04-05 DIAGNOSIS — O99213 Obesity complicating pregnancy, third trimester: Secondary | ICD-10-CM

## 2016-04-05 DIAGNOSIS — O24313 Unspecified pre-existing diabetes mellitus in pregnancy, third trimester: Secondary | ICD-10-CM | POA: Diagnosis present

## 2016-04-05 DIAGNOSIS — E669 Obesity, unspecified: Secondary | ICD-10-CM

## 2016-04-05 DIAGNOSIS — O358XX0 Maternal care for other (suspected) fetal abnormality and damage, not applicable or unspecified: Secondary | ICD-10-CM | POA: Insufficient documentation

## 2016-04-05 LAB — OB RESULTS CONSOLE GBS: STREP GROUP B AG: POSITIVE

## 2016-04-05 LAB — OB RESULTS CONSOLE GC/CHLAMYDIA: GC PROBE AMP, GENITAL: NEGATIVE

## 2016-04-05 MED ORDER — INSULIN NPH (HUMAN) (ISOPHANE) 100 UNIT/ML ~~LOC~~ SUSP: 23.0000 [IU] | Freq: Two times a day (BID) | SUBCUTANEOUS | 3 refills | Status: DC

## 2016-04-05 NOTE — Progress Notes (Signed)
Subjective:  Allison Hess is a 35 y.o. G2P0010 at 1453w0d being seen today for ongoing prenatal care.  She is currently monitored for the following issues for this high-risk pregnancy and has Controlled type 2 diabetes mellitus without complication, without long-term current use of insulin (HCC); Supervision of high risk pregnancy, antepartum; Gestational diabetes mellitus (GDM) affecting pregnancy, antepartum; Known or suspected fetal anomaly affecting care of mother, antepartum; Fetal abnormality affecting management of mother, antepartum; and Obesity in pregnancy on her problem list.  GDM: Patient taking NPH 25 units in AM and 18 units in PM, metformin, and aspart 5units with each meal.  Reports no hypoglycemic episodes.  Tolerating medication well Fasting: 91-117 2hr PP: fairly good control - 17 elevated  Patient reports no complaints.  Contractions: Irritability. Vag. Bleeding: None.  Movement: Present. Denies leaking of fluid.   The following portions of the patient's history were reviewed and updated as appropriate: allergies, current medications, past family history, past medical history, past social history, past surgical history and problem list. Problem list updated.  Objective:   Vitals:   04/05/16 1430  BP: 123/86  Pulse: 96  Weight: 209 lb (94.8 kg)    Fetal Status: Fetal Heart Rate (bpm): 144   Movement: Present  Presentation: Vertex  General:  Alert, oriented and cooperative. Patient is in no acute distress.  Skin: Skin is warm and dry. No rash noted.   Cardiovascular: Normal heart rate noted  Respiratory: Normal respiratory effort, no problems with respiration noted  Abdomen: Soft, gravid, appropriate for gestational age. Pain/Pressure: Present     Pelvic: Vag. Bleeding: None Vag D/C Character: White   Cervical exam performed Dilation: Fingertip Effacement (%): Thick    Extremities: Normal range of motion.  Edema: None  Mental Status: Normal mood and affect. Normal  behavior. Normal judgment and thought content.   Urinalysis: Urine Protein: Trace Urine Glucose: Trace  Assessment and Plan:  Pregnancy: G2P0010 at 3753w0d  1. Supervision of high risk pregnancy in third trimester FHT normal. - GC/Chlamydia probe amp ([place] - Culture, beta strep (group b only)  2. Controlled type 2 diabetes mellitus without complication, without long-term current use of insulin (HCC) Increase AM NPH to 30 and PM NPH to 23 BPP today.  Growth next week  3. Fetal abnormality affecting management of mother, antepartum, single or unspecified fetus   4. Obesity in pregnancy   Term labor symptoms and general obstetric precautions including but not limited to vaginal bleeding, contractions, leaking of fluid and fetal movement were reviewed in detail with the patient. Please refer to After Visit Summary for other counseling recommendations.  No Follow-up on file.   Levie HeritageJacob J Stinson, DO

## 2016-04-06 ENCOUNTER — Other Ambulatory Visit: Payer: Managed Care, Other (non HMO)

## 2016-04-07 LAB — GC/CHLAMYDIA PROBE AMP (~~LOC~~) NOT AT ARMC
Chlamydia: NEGATIVE
Neisseria Gonorrhea: NEGATIVE

## 2016-04-08 LAB — CULTURE, BETA STREP (GROUP B ONLY)

## 2016-04-10 ENCOUNTER — Encounter: Payer: Self-pay | Admitting: Family Medicine

## 2016-04-13 ENCOUNTER — Ambulatory Visit (INDEPENDENT_AMBULATORY_CARE_PROVIDER_SITE_OTHER): Payer: Managed Care, Other (non HMO) | Admitting: Family Medicine

## 2016-04-13 ENCOUNTER — Other Ambulatory Visit (HOSPITAL_COMMUNITY): Payer: Self-pay | Admitting: Maternal and Fetal Medicine

## 2016-04-13 ENCOUNTER — Encounter (HOSPITAL_COMMUNITY): Payer: Self-pay

## 2016-04-13 ENCOUNTER — Telehealth (HOSPITAL_COMMUNITY): Payer: Self-pay | Admitting: *Deleted

## 2016-04-13 ENCOUNTER — Ambulatory Visit (HOSPITAL_COMMUNITY)
Admission: RE | Admit: 2016-04-13 | Discharge: 2016-04-13 | Disposition: A | Discharge status: Home / Self Care | Payer: Managed Care, Other (non HMO) | Source: Ambulatory Visit | Attending: Obstetrics & Gynecology | Admitting: Obstetrics & Gynecology

## 2016-04-13 VITALS — BP 117/76 | HR 92 | Wt 207.0 lb

## 2016-04-13 DIAGNOSIS — O24319 Unspecified pre-existing diabetes mellitus in pregnancy, unspecified trimester: Secondary | ICD-10-CM

## 2016-04-13 DIAGNOSIS — O24313 Unspecified pre-existing diabetes mellitus in pregnancy, third trimester: Secondary | ICD-10-CM | POA: Diagnosis not present

## 2016-04-13 DIAGNOSIS — O359XX0 Maternal care for (suspected) fetal abnormality and damage, unspecified, not applicable or unspecified: Secondary | ICD-10-CM

## 2016-04-13 DIAGNOSIS — IMO0001 Reserved for inherently not codable concepts without codable children: Secondary | ICD-10-CM

## 2016-04-13 DIAGNOSIS — Z362 Encounter for other antenatal screening follow-up: Secondary | ICD-10-CM

## 2016-04-13 DIAGNOSIS — O99213 Obesity complicating pregnancy, third trimester: Secondary | ICD-10-CM | POA: Diagnosis not present

## 2016-04-13 DIAGNOSIS — E119 Type 2 diabetes mellitus without complications: Secondary | ICD-10-CM

## 2016-04-13 DIAGNOSIS — Z794 Long term (current) use of insulin: Secondary | ICD-10-CM

## 2016-04-13 DIAGNOSIS — Z3A37 37 weeks gestation of pregnancy: Secondary | ICD-10-CM

## 2016-04-13 DIAGNOSIS — O99210 Obesity complicating pregnancy, unspecified trimester: Secondary | ICD-10-CM

## 2016-04-13 DIAGNOSIS — O0993 Supervision of high risk pregnancy, unspecified, third trimester: Secondary | ICD-10-CM

## 2016-04-13 MED ORDER — INSULIN ASPART 100 UNIT/ML ~~LOC~~ SOLN: 5.0000 [IU] | Freq: Three times a day (TID) | SUBCUTANEOUS | 12 refills | Status: DC

## 2016-04-13 NOTE — Telephone Encounter (Signed)
Preadmission screen  

## 2016-04-13 NOTE — Progress Notes (Signed)
Patient scheduled for induction Jan3, 2018 at 7 am. Armandina StammerJennifer Jordanne Elsbury RNBSN

## 2016-04-13 NOTE — Progress Notes (Signed)
Subjective:  Allison Hess is a 35 y.o. G2P0010 at 43108w1d being seen today for ongoing prenatal care.  She is currently monitored for the following issues for this high-risk pregnancy and has Controlled type 2 diabetes mellitus without complication, without long-term current use of insulin (HCC); Supervision of high risk pregnancy, antepartum; Gestational diabetes mellitus (GDM) affecting pregnancy, antepartum; Known or suspected fetal anomaly affecting care of mother, antepartum; Fetal abnormality affecting management of mother, antepartum; and Obesity in pregnancy on her problem list.  GDM: Patient taking NPH and aspart and metformin.  Reports no hypoglycemic episodes.  Tolerating medication well Fasting: controlled. 2hr PP: breakfast controlled, after lunch and dinner, elevated to 120-130  Patient reports no complaints.  Contractions: Irritability. Vag. Bleeding: None.  Movement: Present. Denies leaking of fluid.   The following portions of the patient's history were reviewed and updated as appropriate: allergies, current medications, past family history, past medical history, past social history, past surgical history and problem list. Problem list updated.  Objective:   Vitals:   04/13/16 1035  BP: 117/76  Pulse: 92  Weight: 207 lb (93.9 kg)    Fetal Status: Fetal Heart Rate (bpm): 154   Movement: Present     General:  Alert, oriented and cooperative. Patient is in no acute distress.  Skin: Skin is warm and dry. No rash noted.   Cardiovascular: Normal heart rate noted  Respiratory: Normal respiratory effort, no problems with respiration noted  Abdomen: Soft, gravid, appropriate for gestational age. Pain/Pressure: Present     Pelvic: Vag. Bleeding: None Vag D/C Character: Thin   Cervical exam deferred        Extremities: Normal range of motion.  Edema: None  Mental Status: Normal mood and affect. Normal behavior. Normal judgment and thought content.   Urinalysis: Urine Protein:  Trace Urine Glucose: Trace  Assessment and Plan:  Pregnancy: G2P0010 at 2108w1d  1. Supervision of high risk pregnancy in third trimester EFW today: 8#5oz, AC >97% FHT normal  2. Controlled type 2 diabetes mellitus without complication, without long-term current use of insulin (HCC) Continue metformin. Increase aspart to 7 units with lunch and dinner.  3. Obesity in pregnancy   Term labor symptoms and general obstetric precautions including but not limited to vaginal bleeding, contractions, leaking of fluid and fetal movement were reviewed in detail with the patient. Please refer to After Visit Summary for other counseling recommendations.  No Follow-up on file.   Levie HeritageJacob J Stinson, DO

## 2016-04-19 ENCOUNTER — Encounter: Payer: Self-pay | Admitting: *Deleted

## 2016-04-19 ENCOUNTER — Ambulatory Visit (INDEPENDENT_AMBULATORY_CARE_PROVIDER_SITE_OTHER): Payer: Managed Care, Other (non HMO) | Admitting: Obstetrics & Gynecology

## 2016-04-19 ENCOUNTER — Encounter (HOSPITAL_COMMUNITY): Payer: Self-pay

## 2016-04-19 ENCOUNTER — Ambulatory Visit (HOSPITAL_COMMUNITY)
Admission: RE | Admit: 2016-04-19 | Discharge: 2016-04-19 | Disposition: A | Discharge status: Home / Self Care | Payer: Managed Care, Other (non HMO) | Source: Ambulatory Visit | Attending: Obstetrics & Gynecology | Admitting: Obstetrics & Gynecology

## 2016-04-19 VITALS — BP 129/82 | HR 96 | Wt 213.0 lb

## 2016-04-19 DIAGNOSIS — O99210 Obesity complicating pregnancy, unspecified trimester: Secondary | ICD-10-CM

## 2016-04-19 DIAGNOSIS — Z362 Encounter for other antenatal screening follow-up: Secondary | ICD-10-CM | POA: Insufficient documentation

## 2016-04-19 DIAGNOSIS — O24313 Unspecified pre-existing diabetes mellitus in pregnancy, third trimester: Secondary | ICD-10-CM | POA: Insufficient documentation

## 2016-04-19 DIAGNOSIS — Z3A38 38 weeks gestation of pregnancy: Secondary | ICD-10-CM | POA: Diagnosis not present

## 2016-04-19 DIAGNOSIS — E119 Type 2 diabetes mellitus without complications: Secondary | ICD-10-CM

## 2016-04-19 DIAGNOSIS — O24913 Unspecified diabetes mellitus in pregnancy, third trimester: Secondary | ICD-10-CM

## 2016-04-19 DIAGNOSIS — O359XX0 Maternal care for (suspected) fetal abnormality and damage, unspecified, not applicable or unspecified: Secondary | ICD-10-CM

## 2016-04-19 DIAGNOSIS — Z794 Long term (current) use of insulin: Secondary | ICD-10-CM

## 2016-04-19 DIAGNOSIS — O24419 Gestational diabetes mellitus in pregnancy, unspecified control: Secondary | ICD-10-CM

## 2016-04-19 DIAGNOSIS — O358XX0 Maternal care for other (suspected) fetal abnormality and damage, not applicable or unspecified: Secondary | ICD-10-CM | POA: Diagnosis present

## 2016-04-19 DIAGNOSIS — E669 Obesity, unspecified: Secondary | ICD-10-CM

## 2016-04-19 DIAGNOSIS — O99213 Obesity complicating pregnancy, third trimester: Secondary | ICD-10-CM | POA: Diagnosis not present

## 2016-04-19 DIAGNOSIS — IMO0001 Reserved for inherently not codable concepts without codable children: Secondary | ICD-10-CM

## 2016-04-19 DIAGNOSIS — O0990 Supervision of high risk pregnancy, unspecified, unspecified trimester: Secondary | ICD-10-CM

## 2016-04-19 NOTE — Patient Instructions (Signed)
Natural Childbirth Natural childbirth is going through labor and delivery without any drugs to relieve pain. Additionally, fetal monitors are not used, a delivery is not done, and a surgical cut to enlarge the vaginal opening (episiotomy) is not made. With the help of a birthing professional (midwife or health care provider), you direct your own labor and delivery. Many women choose natural childbirth because it makes them feel more in control and in touch with their labor and delivery. Some woman also choose natural childbirth because they are concerned about medicines affecting them and their babies. Pregnant women with a high-risk pregnancy should not attempt natural childbirth. It is better to deliver the infant in a hospital if an emergency situation arises. Sometimes, a health care provider has to get involved for the health and safety of the mother and infant. Techniques for natural childbirth  The Lamaze method-This method teaches parents that having a baby is normal, healthy, and natural. It also teaches the mother to take a neutral position regarding pain medicine and numbing medicines and to make an informed decision about using these medicines when the time comes.  The Bradley method (also called husband-coached birth)-This method teaches the father or partner to be the birth coach. It encourages the mother to exercise and eat a balanced, nutritious diet. It also involves relaxation and deep breathing exercises and preparing the parents for emergency situations. Methods of dealing with labor pain and delivery  Meditation.  Yoga.  Hypnosis.  Acupuncture.  Massage.  Changing positions (walking, rocking, showering, leaning on birth balls).  Lying in warm water or a whirlpool bath.  Finding an activity that keeps your mind off of the labor pain.  Listening to soft music.  Focusing on a particular object (visual imagery). Before going into labor  Be sure you and your spouse or  partner are in agreement about having a natural childbirth.  Decide if your health care provider or a midwife will deliver your baby.  Decide if you will have your baby in the hospital, at a birthing center, or at home.  If you have children, make plans to have someone take care of them when you go to the hospital or birthing center.  Know the distance and the time it takes to go to the hospital or birthing center. Practice going there and time it to be sure.  Have a bag packed with a nightgown, bathrobe, and toiletries. Be ready to take it with you when you go into labor.  Keep phone numbers of your family and friends handy if you need to call someone when you go into labor.  Your spouse or partner should go to all the natural childbirth technique classes.  Talk with your health care provider about the possibility of a medical emergency and what will happen if that occurs. Advantages of natural childbirth  You are in control of your labor and delivery.  You will not take medicines that could affect you and the baby.  There are no invasive procedures, such as an episiotomy.  You and your spouse or partner will work together, which can increase your bond with each other.  In most delivery centers, your family and friends can be involved in the labor and delivery process. Disadvantages of natural childbirth  You will experience pain during your labor and delivery.  The methods of helping relieve your labor pains may not work for you.  You may feel disappointed if you decide to change your mind during labor and not   have a natural childbirth. After the delivery  You will be very tired.  You will be uncomfortable because of your uterus contracting. You will feel soreness around the vagina.  You may feel cold and shaky. This is a natural reaction. This information is not intended to replace advice given to you by your health care provider. Make sure you discuss any questions you  have with your health care provider. Document Released: 03/23/2008 Document Revised: 09/16/2015 Document Reviewed: 12/16/2012 Elsevier Interactive Patient Education  2017 [employer] Inc[employer]  

## 2016-04-19 NOTE — Progress Notes (Signed)
   PRENATAL VISIT NOTE  Subjective:  Allison Hess is a 35 y.o. G2P0010 at 3272w0d being seen today for ongoing prenatal care.  She is currently monitored for the following issues for this high-risk pregnancy and has Controlled type 2 diabetes mellitus without complication, without long-term current use of insulin (HCC); Supervision of high risk pregnancy, antepartum; Gestational diabetes mellitus (GDM) affecting pregnancy, antepartum; Known or suspected fetal anomaly affecting care of mother, antepartum; Fetal abnormality affecting management of mother, antepartum; and Obesity in pregnancy on her problem list.  Patient reports no complaints.  Contractions: Irritability. Vag. Bleeding: None.  Movement: Present. Denies leaking of fluid.   The following portions of the patient's history were reviewed and updated as appropriate: allergies, current medications, past family history, past medical history, past social history, past surgical history and problem list. Problem list updated.  Objective:   Vitals:   04/19/16 1339  BP: 129/82  Pulse: 96  Weight: 213 lb (96.6 kg)    Fetal Status: Fetal Heart Rate (bpm): 145 Fundal Height: 41 cm Movement: Present     General:  Alert, oriented and cooperative. Patient is in no acute distress.  Skin: Skin is warm and dry. No rash noted.   Cardiovascular: Normal heart rate noted  Respiratory: Normal respiratory effort, no problems with respiration noted  Abdomen: Soft, gravid, appropriate for gestational age. Pain/Pressure: Present     Pelvic:  Cervical exam deferred        Extremities: Normal range of motion.  Edema: None  Mental Status: Normal mood and affect. Normal behavior. Normal judgment and thought content.   Assessment and Plan:  Pregnancy: G2P0010 at 8072w0d  1. Supervision of high risk pregnancy, antepartum  2. Controlled type 2 diabetes mellitus without complication, without long-term current use of insulin (HCC)  Pt did not bring glc log  reports fasting less than 90 2 hour all less than 120   3. Gestational diabetes mellitus (GDM) affecting pregnancy, antepartum  4. Fetal abnormality affecting management of mother, antepartum, single or unspecified fetus  NST reviewed and reactive. Scheduled for IOL in 1 week  5. Obesity in pregnancy  Term labor symptoms and general obstetric precautions including but not limited to vaginal bleeding, contractions, leaking of fluid and fetal movement were reviewed in detail with the patient. Please refer to After Visit Summary for other counseling recommendations.  Return in about 6 weeks (around 05/31/2016).   Willodean Rosenthalarolyn Harraway-Smith, MD

## 2016-04-20 ENCOUNTER — Encounter: Payer: Self-pay | Admitting: *Deleted

## 2016-04-20 LAB — FETAL NONSTRESS TEST

## 2016-04-24 NOTE — L&D Delivery Note (Signed)
Delivery Note Patient was induced into labor due to diabetes. She was placed on Pitocin.   At 4:25 AM a viable female was delivered via  (Presentation: ROA).  APGAR: 7,10 ; weight 3770g .   Placenta status: intact, sponatenous Cord: 3 vessels with the following complications: none. Cord pH: not obtained  Anesthesia:  epidural Episiotomy:  None Lacerations:  2nd degree perineal Suture Repair: 2.0 vicryl Est. Blood Loss (mL):  500cc  Mom to postpartum.  Baby to Couplet care / Skin to Skin.  Allison DinningChristina M Hess 04/27/2016, 4:49 AM  Patient is a G2P10010 at 6139w1d who was admitted for IOL due to Type 2 DM.  She progressed with augmentation via FB, Pit, AROM.  I was gloved and present for delivery in its entirety.  Second stage of labor progressed to SVD.  Mild decels during second stage noted.  Complications: none  Lacerations: 2nd deg perineal  EBL: 500  SHAW, KIMBERLY, CNM 12:15 AM

## 2016-04-26 ENCOUNTER — Inpatient Hospital Stay (HOSPITAL_COMMUNITY): Payer: Managed Care, Other (non HMO) | Admitting: Anesthesiology

## 2016-04-26 ENCOUNTER — Inpatient Hospital Stay (HOSPITAL_COMMUNITY)
Admission: RE | Admit: 2016-04-26 | Discharge: 2016-04-28 | DRG: 774 | Disposition: A | Discharge status: Home / Self Care | Payer: Managed Care, Other (non HMO) | Source: Ambulatory Visit | Attending: Obstetrics & Gynecology | Admitting: Obstetrics & Gynecology

## 2016-04-26 ENCOUNTER — Ambulatory Visit (HOSPITAL_COMMUNITY): Admission: RE | Admit: 2016-04-26 | Payer: Managed Care, Other (non HMO) | Source: Ambulatory Visit

## 2016-04-26 ENCOUNTER — Encounter (HOSPITAL_COMMUNITY): Payer: Self-pay

## 2016-04-26 DIAGNOSIS — O99824 Streptococcus B carrier state complicating childbirth: Secondary | ICD-10-CM | POA: Diagnosis present

## 2016-04-26 DIAGNOSIS — Z880 Allergy status to penicillin: Secondary | ICD-10-CM

## 2016-04-26 DIAGNOSIS — O99214 Obesity complicating childbirth: Secondary | ICD-10-CM | POA: Diagnosis present

## 2016-04-26 DIAGNOSIS — O2412 Pre-existing diabetes mellitus, type 2, in childbirth: Secondary | ICD-10-CM | POA: Diagnosis present

## 2016-04-26 DIAGNOSIS — Z833 Family history of diabetes mellitus: Secondary | ICD-10-CM | POA: Diagnosis not present

## 2016-04-26 DIAGNOSIS — Z87891 Personal history of nicotine dependence: Secondary | ICD-10-CM

## 2016-04-26 DIAGNOSIS — O358XX0 Maternal care for other (suspected) fetal abnormality and damage, not applicable or unspecified: Secondary | ICD-10-CM | POA: Diagnosis present

## 2016-04-26 DIAGNOSIS — E669 Obesity, unspecified: Secondary | ICD-10-CM | POA: Diagnosis present

## 2016-04-26 DIAGNOSIS — E119 Type 2 diabetes mellitus without complications: Secondary | ICD-10-CM | POA: Diagnosis present

## 2016-04-26 DIAGNOSIS — Z794 Long term (current) use of insulin: Secondary | ICD-10-CM

## 2016-04-26 DIAGNOSIS — O99210 Obesity complicating pregnancy, unspecified trimester: Secondary | ICD-10-CM

## 2016-04-26 DIAGNOSIS — Z6836 Body mass index (BMI) 36.0-36.9, adult: Secondary | ICD-10-CM | POA: Diagnosis not present

## 2016-04-26 DIAGNOSIS — Z3A39 39 weeks gestation of pregnancy: Secondary | ICD-10-CM

## 2016-04-26 DIAGNOSIS — O0990 Supervision of high risk pregnancy, unspecified, unspecified trimester: Secondary | ICD-10-CM

## 2016-04-26 DIAGNOSIS — O3663X0 Maternal care for excessive fetal growth, third trimester, not applicable or unspecified: Secondary | ICD-10-CM | POA: Diagnosis present

## 2016-04-26 DIAGNOSIS — Z8249 Family history of ischemic heart disease and other diseases of the circulatory system: Secondary | ICD-10-CM

## 2016-04-26 DIAGNOSIS — O24424 Gestational diabetes mellitus in childbirth, insulin controlled: Secondary | ICD-10-CM | POA: Diagnosis not present

## 2016-04-26 DIAGNOSIS — O24319 Unspecified pre-existing diabetes mellitus in pregnancy, unspecified trimester: Secondary | ICD-10-CM | POA: Diagnosis present

## 2016-04-26 LAB — TYPE AND SCREEN
ABO/RH(D): A POS
ANTIBODY SCREEN: NEGATIVE

## 2016-04-26 LAB — GLUCOSE, CAPILLARY
GLUCOSE-CAPILLARY: 108 mg/dL — AB (ref 65–99)
GLUCOSE-CAPILLARY: 112 mg/dL — AB (ref 65–99)
Glucose-Capillary: 89 mg/dL (ref 65–99)
Glucose-Capillary: 67 mg/dL (ref 65–99)
Glucose-Capillary: 106 mg/dL — ABNORMAL HIGH (ref 65–99)
Glucose-Capillary: 73 mg/dL (ref 65–99)
Glucose-Capillary: 118 mg/dL — ABNORMAL HIGH (ref 65–99)

## 2016-04-26 LAB — CBC
HCT: 36.7 % (ref 36.0–46.0)
Hemoglobin: 11.8 g/dL — ABNORMAL LOW (ref 12.0–15.0)
MCH: 26.2 pg (ref 26.0–34.0)
MCHC: 32.2 g/dL (ref 30.0–36.0)
MCV: 81.6 fL (ref 78.0–100.0)
Platelets: 249 10*3/uL (ref 150–400)
RBC: 4.5 MIL/uL (ref 3.87–5.11)
RDW: 14.6 % (ref 11.5–15.5)
WBC: 8.8 10*3/uL (ref 4.0–10.5)

## 2016-04-26 LAB — ABO/RH: ABO/RH(D): A POS

## 2016-04-26 LAB — RPR: RPR Ser Ql: NONREACTIVE

## 2016-04-26 MED ORDER — ACETAMINOPHEN 325 MG PO TABS: 650.0000 mg | ORAL_TABLET | ORAL | Status: DC | PRN

## 2016-04-26 MED ORDER — OXYTOCIN 40 UNITS IN LACTATED RINGERS INFUSION - SIMPLE MED
2.5000 [IU]/h | INTRAVENOUS | Status: DC
  Administered 2016-04-27: 2.5 [IU]/h via INTRAVENOUS
  Filled 2016-04-26: qty 1000

## 2016-04-26 MED ORDER — OXYTOCIN BOLUS FROM INFUSION
500.0000 mL | Freq: Once | INTRAVENOUS | Status: AC
  Administered 2016-04-27: 500 mL via INTRAVENOUS

## 2016-04-26 MED ORDER — TERBUTALINE SULFATE 1 MG/ML IJ SOLN
0.2500 mg | Freq: Once | INTRAMUSCULAR | Status: DC | PRN
  Filled 2016-04-26: qty 1

## 2016-04-26 MED ORDER — DIPHENHYDRAMINE HCL 50 MG/ML IJ SOLN: 12.5000 mg | INTRAMUSCULAR | Status: DC | PRN

## 2016-04-26 MED ORDER — OXYCODONE-ACETAMINOPHEN 5-325 MG PO TABS: 2.0000 | ORAL_TABLET | ORAL | Status: DC | PRN

## 2016-04-26 MED ORDER — SOD CITRATE-CITRIC ACID 500-334 MG/5ML PO SOLN: 30.0000 mL | ORAL | Status: DC | PRN

## 2016-04-26 MED ORDER — LACTATED RINGERS IV SOLN
500.0000 mL | Freq: Once | INTRAVENOUS | Status: AC
  Administered 2016-04-26: 500 mL via INTRAVENOUS

## 2016-04-26 MED ORDER — OXYTOCIN 40 UNITS IN LACTATED RINGERS INFUSION - SIMPLE MED
1.0000 m[IU]/min | INTRAVENOUS | Status: DC
  Administered 2016-04-26: 2 m[IU]/min via INTRAVENOUS

## 2016-04-26 MED ORDER — LACTATED RINGERS IV SOLN
INTRAVENOUS | Status: DC
  Administered 2016-04-26 (×4): via INTRAVENOUS

## 2016-04-26 MED ORDER — LIDOCAINE HCL (PF) 1 % IJ SOLN
30.0000 mL | INTRAMUSCULAR | Status: DC | PRN
  Filled 2016-04-26: qty 30

## 2016-04-26 MED ORDER — SODIUM CHLORIDE 0.9 % IV BOLUS (SEPSIS): 500.0000 mL | Freq: Once | INTRAVENOUS | Status: DC

## 2016-04-26 MED ORDER — EPHEDRINE 5 MG/ML INJ
10.0000 mg | INTRAVENOUS | Status: DC | PRN
  Filled 2016-04-26: qty 4

## 2016-04-26 MED ORDER — OXYCODONE-ACETAMINOPHEN 5-325 MG PO TABS: 1.0000 | ORAL_TABLET | ORAL | Status: DC | PRN

## 2016-04-26 MED ORDER — FENTANYL CITRATE (PF) 100 MCG/2ML IJ SOLN: 50.0000 ug | INTRAMUSCULAR | Status: DC | PRN

## 2016-04-26 MED ORDER — LACTATED RINGERS IV SOLN
500.0000 mL | INTRAVENOUS | Status: DC | PRN
  Administered 2016-04-26: 500 mL via INTRAVENOUS

## 2016-04-26 MED ORDER — FLEET ENEMA 7-19 GM/118ML RE ENEM: 1.0000 | ENEMA | RECTAL | Status: DC | PRN

## 2016-04-26 MED ORDER — HYDROXYZINE HCL 50 MG PO TABS
50.0000 mg | ORAL_TABLET | Freq: Four times a day (QID) | ORAL | Status: DC | PRN
  Filled 2016-04-26: qty 1

## 2016-04-26 MED ORDER — PHENYLEPHRINE 40 MCG/ML (10ML) SYRINGE FOR IV PUSH (FOR BLOOD PRESSURE SUPPORT)
80.0000 ug | PREFILLED_SYRINGE | INTRAVENOUS | Status: DC | PRN
  Administered 2016-04-26: 80 ug via INTRAVENOUS
  Filled 2016-04-26: qty 10
  Filled 2016-04-26: qty 5

## 2016-04-26 MED ORDER — METFORMIN HCL 500 MG PO TABS
1000.0000 mg | ORAL_TABLET | Freq: Two times a day (BID) | ORAL | Status: DC
  Administered 2016-04-26 – 2016-04-28 (×4): 1000 mg via ORAL
  Filled 2016-04-26 (×8): qty 2

## 2016-04-26 MED ORDER — VANCOMYCIN HCL IN DEXTROSE 1-5 GM/200ML-% IV SOLN
1000.0000 mg | Freq: Two times a day (BID) | INTRAVENOUS | Status: DC
  Administered 2016-04-26 (×2): 1000 mg via INTRAVENOUS
  Filled 2016-04-26 (×3): qty 200

## 2016-04-26 MED ORDER — FENTANYL 2.5 MCG/ML BUPIVACAINE 1/10 % EPIDURAL INFUSION (WH - ANES)
14.0000 mL/h | INTRAMUSCULAR | Status: DC | PRN
  Administered 2016-04-26 – 2016-04-27 (×2): 14 mL/h via EPIDURAL
  Filled 2016-04-26 (×2): qty 100

## 2016-04-26 MED ORDER — PHENYLEPHRINE 40 MCG/ML (10ML) SYRINGE FOR IV PUSH (FOR BLOOD PRESSURE SUPPORT)
80.0000 ug | PREFILLED_SYRINGE | INTRAVENOUS | Status: DC | PRN
  Filled 2016-04-26: qty 5
  Filled 2016-04-26: qty 10

## 2016-04-26 MED ORDER — METFORMIN HCL 500 MG PO TABS: 1000.0000 mg | ORAL_TABLET | Freq: Two times a day (BID) | ORAL | Status: DC

## 2016-04-26 MED ORDER — LIDOCAINE HCL (PF) 1 % IJ SOLN
INTRAMUSCULAR | Status: DC | PRN
  Administered 2016-04-26: 8 mL via EPIDURAL
  Administered 2016-04-26: 6 mL via EPIDURAL

## 2016-04-26 MED ORDER — ONDANSETRON HCL 4 MG/2ML IJ SOLN: 4.0000 mg | Freq: Four times a day (QID) | INTRAMUSCULAR | Status: DC | PRN

## 2016-04-26 NOTE — Progress Notes (Signed)
Dr. Anabel BeneGambino/Kim Shaw at bedside for vaginal exam and IUPC placement at 2125. IUPC working and functioning properly. Patient repositioned at 2148 to left lateral with peanut ball. IUPC no longer tracing contractions effectively. RN rezeroed and exchanged chords, checked for accidental removal and found IUPC still in place. Dr. Jonathon JordanGambino notified at 2201, and at bedside at 2205. IUPC removed at 2205 and replaced at 2207. RN and MD at bedside assessing IUPC function. IUPC removed at 2221 and Toco reapplied. No new orders at this time. Will continue to monitor.

## 2016-04-26 NOTE — Progress Notes (Signed)
Patient seen. Contractions every 2 minutes with no cervical change. Plan AROM at this time. Expect vaginal delivery.

## 2016-04-26 NOTE — Anesthesia Pain Management Evaluation Note (Signed)
  CRNA Pain Management Visit Note  Patient: Allison Hess, 36 y.o., female  "Hello I am a member of the anesthesia team at [facility]. We have an anesthesia team available at all times to provide care throughout the hospital, including epidural management and anesthesia for C-section. I don't know your plan for the delivery whether it a natural birth, water birth, IV sedation, nitrous supplementation, doula or epidural, but we want to meet your pain goals."   1.Was your pain managed to your expectations on prior hospitalizations?   No prior hospitalizations  2.What is your expectation for pain management during this hospitalization?     Epidural  3.How can we help you reach that goal? unsure  Record the patient's initial score and the patient's pain goal.   Pain: 1  Pain Goal: 8 [facility] wants you to be able to say your pain was always managed very well.  Cephus ShellingBURGER,Allison Hess 04/26/2016

## 2016-04-26 NOTE — H&P (Signed)
Allison Hess is a 36 y.o. G2P0010 female at [redacted]w[redacted]d by LMP c/w 13wk u/s, presenting for IOL d/t A2/BDM.   Reports active fetal movement, contractions: none, vaginal bleeding: none, membranes: intact. Initiated prenatal care at [facility] at 8 wks.   Most recent growth u/s 04/13/16 @ 37.1wks, efw 3759g/>90%, AFI 17, suspected bladder obstruction.   This pregnancy complicated by: Type 2 DM- dx in 2013, on metformin and insulin Fetal bilateral pyelectasis w/ megacystitis and possible bladder outlet obstruction Obesity LGA  Prenatal History/Complications:  SAB x 1  Past Medical History: Past Medical History:  Diagnosis Date  . Diabetes mellitus without complication [facility])     Past Surgical History: History reviewed. No pertinent surgical history.  Obstetrical History: OB History    Gravida Para Term Preterm AB Living   2       1 0   SAB TAB Ectopic Multiple Live Births   1              Social History: Social History   Social History  . Marital status: Single    Spouse name: N/A  . Number of children: N/A  . Years of education: N/A   Occupational History  . Disability intake    Social History Main Topics  . Smoking status: Former Smoker    Packs/day: 1.00    Years: 5.00    Types: Cigarettes    Quit date: 09/22/2005  . Smokeless tobacco: Never Used  . Alcohol use No  . Drug use: No  . Sexual activity: Yes    Partners: Male    Birth control/ protection: None   Other Topics Concern  . None   Social History Narrative  . None    Family History: Family History  Problem Relation Age of Onset  . Diabetes Mother   . Hypertension Mother   . Diabetes Father   . Hypertension Father   . Cancer Maternal Grandmother     breast    Allergies: Allergies  Allergen Reactions  . Erythromycin Rash  . Penicillins Hives    Has patient had a PCN reaction causing immediate rash, facial/tongue/throat swelling, SOB or lightheadedness with hypotension: Yes Has patient had a  PCN reaction causing severe rash involving mucus membranes or skin necrosis: No Has patient had a PCN reaction that required hospitalization No Has patient had a PCN reaction occurring within the last 10 years: No If all of the above answers are "NO", then may proceed with Cephalosporin use.   Marland Kitchen Amoxicillin Rash    Has patient had a PCN reaction causing immediate rash, facial/tongue/throat swelling, SOB or lightheadedness with hypotension: Yes Has patient had a PCN reaction causing severe rash involving mucus membranes or skin necrosis: No Has patient had a PCN reaction that required hospitalization No Has patient had a PCN reaction occurring within the last 10 years: No If all of the above answers are "NO", then may proceed with Cephalosporin use.     Prescriptions Prior to Admission  Medication Sig Dispense Refill Last Dose  . acetaminophen (TYLENOL) 500 MG tablet Take 1,000 mg by mouth every 6 (six) hours as needed for mild pain or headache.   Past Week at Unknown time  . diphenhydrAMINE (BENADRYL) 25 MG tablet Take 25 mg by mouth at bedtime as needed for allergies.   Past Week at Unknown time  . insulin aspart (NOVOLOG) 100 UNIT/ML injection Inject 5-7 Units into the skin 3 (three) times daily before meals. 5/7/7 (Patient taking differently: Inject  5-7 Units into the skin 3 (three) times daily before meals. 5 units at breakfast, 7 units at lunch, 7 units at dinner) 10 mL 12 04/25/2016 at Unknown time  . insulin NPH Human (HUMULIN N,NOVOLIN N) 100 UNIT/ML injection Inject 0.23-0.25 mLs (23-25 Units total) into the skin 2 (two) times daily. 25 units before breakfast, 13 units at bedtime (Patient taking differently: Inject 23 Units into the skin 2 (two) times daily. ) 10 mL 3 04/25/2016 at Unknown time  . Insulin Syringe-Needle U-100 31G X 15/64" 0.5 ML MISC 1 Syringe by Does not apply route 5 (five) times daily. For DM2 E11.9. 100 each 12 Not Taking  . metFORMIN (GLUCOPHAGE) 1000 MG tablet Take 1  tablet (1,000 mg total) by mouth 2 (two) times daily with a meal. 60 tablet 3 04/25/2016 at Unknown time    Review of Systems  Pertinent pos/neg as indicated in HPI  Blood pressure 116/75, pulse 98, temperature 97.7 F (36.5 C), temperature source Oral, resp. rate 16, height 5\' 4"  (1.626 m), weight 96.2 kg (212 lb), last menstrual period 07/28/2015. General appearance: alert, cooperative and no distress Lungs: clear to auscultation bilaterally Heart: regular rate and rhythm Abdomen: gravid, soft, non-tender Extremities: No edema DTR's 2+  Fetal monitoring: FHR: 135 bpm, variability: moderate,  Accelerations: Present,  decelerations:  Absent Uterine activity: ui Dilation: 2.5 Effacement (%): 50 Station: -2 Exam by:: Genella Rife CNM Presentation: cephalic   Cervical foley bulb inserted and inflated w/ 60ml LR w/o difficulty    Prenatal labs: ABO, Rh: --/--/A POS (01/03 1610) Antibody: PENDING (01/03 0755) Rubella: !Error! RPR: NON REAC (10/12 1204)  HBsAg: NEGATIVE (06/01 1002)  HIV: NONREACTIVE (10/12 1204)  GBS: Positive (12/13 0000)   1 hr Glucola: n/a, pre-existing Type 2 DM, A1C in June 7.0 Genetic screening:  AFP neg Anatomy US: female, thickened bladder, bilateral pyelectasis, megacystitis, suspected outlet obstruction  Results for orders placed or performed during the hospital encounter of 04/26/16 (from the past 24 hour(s))  CBC   Collection Time: 04/26/16  7:55 AM  Result Value Ref Range   WBC 8.8 4.0 - 10.5 K/uL   RBC 4.50 3.87 - 5.11 MIL/uL   Hemoglobin 11.8 (L) 12.0 - 15.0 g/dL   HCT 96.0 45.4 - 09.8 %   MCV 81.6 78.0 - 100.0 fL   MCH 26.2 26.0 - 34.0 pg   MCHC 32.2 30.0 - 36.0 g/dL   RDW 11.9 14.7 - 82.9 %   Platelets 249 150 - 400 K/uL  Type and screen [facility]   Collection Time: 04/26/16  7:55 AM  Result Value Ref Range   ABO/RH(D) A POS    Antibody Screen PENDING    Sample Expiration 04/29/2016   Glucose, capillary    Collection Time: 04/26/16  8:18 AM  Result Value Ref Range   Glucose-Capillary 89 65 - 99 mg/dL     Assessment:  [redacted]w[redacted]d SIUP  G2P0010  IOL for Type2DM  Suspected LGA  Obesity  Suspected fetal bladder outlet obstruction/bilateral pyelectasis, megacystitis  Cat 1 FHR  GBS Positive (12/13 0000)  Plan:  Admit to BS  IV pain meds/epidural prn active labor  Can eat light carb modified breakfast, will cover w/ her novolog 5units  Continue metformin 1,000mg  BID- has had AM dose prior to coming  Hold NPH- did take AM dose prior to coming  CBGs q 4hr cervical ripening phase, then q 2hr active phase  Cervical foley bulb placed, plan pitocin when it  falls out  Start Vancomycin 1gm q 12hr for gbs+ w/ labor/rom/or pit-whichever 1st, allergic to pcn w/ hives, no sensitivities done  Anticipate NSVB   Plans to breastfeed  Contraception: mirena  Circumcision: n/a  Marge DuncansBooker, Marquarius Lofton Randall CNM, WHNP-BC 04/26/2016, 8:51 AM

## 2016-04-26 NOTE — Anesthesia Procedure Notes (Signed)
Epidural Patient location during procedure: OB Start time: 04/26/2016 6:57 PM End time: 04/26/2016 7:00 PM  Staffing Anesthesiologist: Leilani AbleHATCHETT, Anjalina Bergevin Performed: anesthesiologist   Preanesthetic Checklist Completed: patient identified, surgical consent, pre-op evaluation, timeout performed, IV checked, risks and benefits discussed and monitors and equipment checked  Epidural Patient position: sitting Prep: site prepped and draped and DuraPrep Patient monitoring: continuous pulse ox and blood pressure Approach: midline Location: L3-L4 Injection technique: LOR air  Needle:  Needle type: Tuohy  Needle gauge: 17 G Needle length: 9 cm and 9 Needle insertion depth: 6 cm Catheter type: closed end flexible Catheter size: 19 Gauge Catheter at skin depth: 11 cm Test dose: negative and Other  Assessment Sensory level: T9 Events: blood not aspirated, injection not painful, no injection resistance, negative IV test and no paresthesia  Additional Notes Reason for block:procedure for pain

## 2016-04-26 NOTE — Progress Notes (Signed)
Allison Hess is a 36 y.o. G2P0010 at 6439w0d by LMP confirmed with U/S admitted for induction of labor due to Diabetes.  Subjective: Introduced self to patient as night team physician. Patient doing well. No complaints at this time. No questions.   Objective: BP 105/72   Pulse 79   Temp 97.4 F (36.3 C) (Oral)   Resp 16   Ht 5\' 4"  (1.626 m)   Wt 96.2 kg (212 lb)   LMP 07/28/2015 (Exact Date)   SpO2 97%   BMI 36.39 kg/m  No intake/output data recorded. No intake/output data recorded.  FHT:  FHR: 145 bpm, variability: moderate,  accelerations:  Present,  decelerations:  Present 2 late decels in last 15 minutes UC:   regular, every 2-3 minutes SVE:   Dilation: 5 Effacement (%): 60, 70 Station: -2 Exam by:: Philipp DeputyShaw, Kim CNM (Dr. Jonathon JordanGambino)  Labs: Lab Results  Component Value Date   WBC 8.8 04/26/2016   HGB 11.8 (L) 04/26/2016   HCT 36.7 04/26/2016   MCV 81.6 04/26/2016   PLT 249 04/26/2016    Assessment / Plan: Induction of labor due to diabetes,  progressing well on pitocin  Labor: Progressing normally on Pitocin. S/p AROM. IUPC placed.  Fetal Wellbeing:  Category II. Will give patient some fluid and reposition and continue to monitor closesly Pain Control:  Epidural I/D:  GBS positive- on Vanc Anticipated MOD:  NSVD  Beaulah DinningChristina M Gambino 04/26/2016, 9:57 PM

## 2016-04-26 NOTE — Anesthesia Preprocedure Evaluation (Signed)
Anesthesia Evaluation  Patient identified by MRN, date of birth, ID band Patient awake    Reviewed: Allergy & Precautions, H&P , NPO status , Patient's Chart, lab work & pertinent test results  Airway Mallampati: II  TM Distance: >3 FB Neck ROM: full    Dental no notable dental hx.    Pulmonary neg pulmonary ROS, former smoker,    Pulmonary exam normal        Cardiovascular negative cardio ROS Normal cardiovascular exam     Neuro/Psych negative neurological ROS     GI/Hepatic   Endo/Other  diabetes, Type 2, Insulin Dependent, Oral Hypoglycemic Agents  Renal/GU      Musculoskeletal   Abdominal (+) + obese,   Peds  Hematology negative hematology ROS (+)   Anesthesia Other Findings   Reproductive/Obstetrics (+) Pregnancy                             Anesthesia Physical Anesthesia Plan  ASA: II  Anesthesia Plan: Epidural   Post-op Pain Management:    Induction:   Airway Management Planned:   Additional Equipment:   Intra-op Plan:   Post-operative Plan:   Informed Consent: I have reviewed the patients History and Physical, chart, labs and discussed the procedure including the risks, benefits and alternatives for the proposed anesthesia with the patient or authorized representative who has indicated his/her understanding and acceptance.     Plan Discussed with:   Anesthesia Plan Comments:         Anesthesia Quick Evaluation

## 2016-04-27 ENCOUNTER — Encounter (HOSPITAL_COMMUNITY): Payer: Self-pay

## 2016-04-27 DIAGNOSIS — O24319 Unspecified pre-existing diabetes mellitus in pregnancy, unspecified trimester: Secondary | ICD-10-CM | POA: Diagnosis present

## 2016-04-27 DIAGNOSIS — Z3A39 39 weeks gestation of pregnancy: Secondary | ICD-10-CM

## 2016-04-27 DIAGNOSIS — O358XX0 Maternal care for other (suspected) fetal abnormality and damage, not applicable or unspecified: Secondary | ICD-10-CM

## 2016-04-27 DIAGNOSIS — O99824 Streptococcus B carrier state complicating childbirth: Secondary | ICD-10-CM

## 2016-04-27 DIAGNOSIS — O24424 Gestational diabetes mellitus in childbirth, insulin controlled: Secondary | ICD-10-CM

## 2016-04-27 LAB — GLUCOSE, CAPILLARY
GLUCOSE-CAPILLARY: 147 mg/dL — AB (ref 65–99)
GLUCOSE-CAPILLARY: 148 mg/dL — AB (ref 65–99)
GLUCOSE-CAPILLARY: 126 mg/dL — AB (ref 65–99)
Glucose-Capillary: 107 mg/dL — ABNORMAL HIGH (ref 65–99)
Glucose-Capillary: 175 mg/dL — ABNORMAL HIGH (ref 65–99)
Glucose-Capillary: 171 mg/dL — ABNORMAL HIGH (ref 65–99)

## 2016-04-27 MED ORDER — OXYCODONE HCL 5 MG PO TABS: 5.0000 mg | ORAL_TABLET | ORAL | Status: DC | PRN

## 2016-04-27 MED ORDER — ZOLPIDEM TARTRATE 5 MG PO TABS: 5.0000 mg | ORAL_TABLET | Freq: Every evening | ORAL | Status: DC | PRN

## 2016-04-27 MED ORDER — DIPHENHYDRAMINE HCL 25 MG PO CAPS: 25.0000 mg | ORAL_CAPSULE | Freq: Four times a day (QID) | ORAL | Status: DC | PRN

## 2016-04-27 MED ORDER — ONDANSETRON HCL 4 MG/2ML IJ SOLN: 4.0000 mg | INTRAMUSCULAR | Status: DC | PRN

## 2016-04-27 MED ORDER — ACETAMINOPHEN 325 MG PO TABS: 650.0000 mg | ORAL_TABLET | ORAL | Status: DC | PRN

## 2016-04-27 MED ORDER — IBUPROFEN 600 MG PO TABS
600.0000 mg | ORAL_TABLET | Freq: Four times a day (QID) | ORAL | Status: DC
Start: 2016-04-27 — End: 2016-04-28
  Administered 2016-04-27 – 2016-04-28 (×5): 600 mg via ORAL
  Filled 2016-04-27 (×5): qty 1

## 2016-04-27 MED ORDER — WITCH HAZEL-GLYCERIN EX PADS: 1.0000 "application " | MEDICATED_PAD | CUTANEOUS | Status: DC | PRN

## 2016-04-27 MED ORDER — ONDANSETRON HCL 4 MG PO TABS: 4.0000 mg | ORAL_TABLET | ORAL | Status: DC | PRN

## 2016-04-27 MED ORDER — COCONUT OIL OIL: 1.0000 "application " | TOPICAL_OIL | Status: DC | PRN

## 2016-04-27 MED ORDER — TETANUS-DIPHTH-ACELL PERTUSSIS 5-2.5-18.5 LF-MCG/0.5 IM SUSP: 0.5000 mL | Freq: Once | INTRAMUSCULAR | Status: DC

## 2016-04-27 MED ORDER — SIMETHICONE 80 MG PO CHEW: 80.0000 mg | CHEWABLE_TABLET | ORAL | Status: DC | PRN

## 2016-04-27 MED ORDER — PRENATAL MULTIVITAMIN CH
1.0000 | ORAL_TABLET | Freq: Every day | ORAL | Status: DC
Start: 2016-04-27 — End: 2016-04-28
  Administered 2016-04-27 – 2016-04-28 (×2): 1 via ORAL
  Filled 2016-04-27 (×2): qty 1

## 2016-04-27 MED ORDER — SENNOSIDES-DOCUSATE SODIUM 8.6-50 MG PO TABS
2.0000 | ORAL_TABLET | ORAL | Status: DC
  Administered 2016-04-27: 2 via ORAL
  Filled 2016-04-27: qty 2

## 2016-04-27 MED ORDER — INSULIN ASPART 100 UNIT/ML ~~LOC~~ SOLN
5.0000 [IU] | Freq: Once | SUBCUTANEOUS | Status: AC
  Administered 2016-04-27: 5 [IU] via SUBCUTANEOUS

## 2016-04-27 MED ORDER — BENZOCAINE-MENTHOL 20-0.5 % EX AERO: 1.0000 "application " | INHALATION_SPRAY | CUTANEOUS | Status: DC | PRN

## 2016-04-27 MED ORDER — DIBUCAINE 1 % RE OINT: 1.0000 "application " | TOPICAL_OINTMENT | RECTAL | Status: DC | PRN

## 2016-04-27 NOTE — Progress Notes (Signed)
Allison Hess is a 36 y.o. G2P0010 at 7021w0d by LMP confirmed with U/S admitted for induction of labor due to Diabetes  Subjective: Patient feeling more pressure now. No urge to push yet.   Objective: BP 128/90   Pulse 78   Temp 98 F (36.7 C) (Oral)   Resp 16   Ht 5\' 4"  (1.626 m)   Wt 96.2 kg (212 lb)   LMP 07/28/2015 (Exact Date)   SpO2 97%   BMI 36.39 kg/m  No intake/output data recorded. No intake/output data recorded.  FHT:  FHR: 135 bpm, variability: minimal ,  accelerations:  Present,  decelerations:  Absent UC:   regular, every 2-3 minutes SVE:   Dilation: 9 Effacement (%): 80 Station: -1 Exam by:: Dr. Jonathon JordanGambino  Labs: Lab Results  Component Value Date   WBC 8.8 04/26/2016   HGB 11.8 (L) 04/26/2016   HCT 36.7 04/26/2016   MCV 81.6 04/26/2016   PLT 249 04/26/2016    Assessment / Plan: Induction of labor due to diabetes,  progressing well on pitocin  Labor: Progressing normally on Pitocin 8. IUPC not working likely from faulty wiring. Toco in place.  Fetal Wellbeing:  Category I with earlier minimal variability. Patient changes positions and now with moderate variablity. No decels. Will continue to monitor closesly Pain Control:  Epidural I/D:  GBS positive- on Vanc Anticipated MOD:  NSVD  Beaulah DinningChristina M Gambino 04/27/2016, 2:10 AM

## 2016-04-27 NOTE — Anesthesia Postprocedure Evaluation (Signed)
Anesthesia Post Note  Patient: Allison Hess  Procedure(s) Performed: * No procedures listed *  Patient location during evaluation: Mother Baby Anesthesia Type: Epidural Level of consciousness: awake and alert Pain management: pain level controlled Vital Signs Assessment: post-procedure vital signs reviewed and stable Respiratory status: spontaneous breathing, nonlabored ventilation and respiratory function stable Cardiovascular status: stable Postop Assessment: no headache, no backache and epidural receding Anesthetic complications: no        Last Vitals:  Vitals:   04/27/16 0715 04/27/16 0835  BP: 131/87 131/71  Pulse: 95 99  Resp: 18 16  Temp: 36.9 C 36.6 C    Last Pain:  Vitals:   04/27/16 1300  TempSrc:   PainSc: 0-No pain   Pain Goal:                 EchoStarMERRITT,Grove Defina

## 2016-04-27 NOTE — Progress Notes (Signed)
Spoke with teaching service resident Serafina Royalsan Warden, MD to inform about blood sugar of 147, non-fasting. No new orders at this time. Sherald BargeMatthews, Erwin Nishiyama L

## 2016-04-27 NOTE — Lactation Note (Signed)
This note was copied from a baby's chart. Lactation Consultation Note  Mom is requesting help with BF. Baby was unwrapped and placed skin to skin with mother. Drops of colostrum were expressed and he was positioned to latch. He was not cueing to hungry and was content to sleep skin-to-skin on her chest. Mother reports that his first feeding was good but that he was removed for transport purposes. Reviewed feeding cues. Hand expression taught with colostrum visible.  Informed mom that in the event that baby did not latch colostrum could be hand expressed and spoon fed. Has information on support groups and outpatient services.  Patient Name: Allison Hess ZOXWR'UToday's Date: 04/27/2016 Reason for consult: Follow-up assessment   Maternal Data Has patient been taught Hand Expression?: Yes Does the patient have breastfeeding experience prior to this delivery?: No  Feeding Feeding Type: Breast Fed Length of feed: 0 min (not hungry)  LATCH Score/Interventions                      Lactation Tools Discussed/Used     Consult Status Consult Status: Follow-up Date: 04/27/16 Follow-up type: In-patient    Soyla DryerJoseph, King Pinzon 04/27/2016, 1:04 PM

## 2016-04-27 NOTE — Lactation Note (Addendum)
This note was copied from a baby's chart. Lactation Consultation Note  Mom sleepy at this consult.  Baby skin to skin with FOB. Explained to parents that IBCLC understood that baby was skin-to-skin on dad related to maternal fatigue but that anytime she was able it was best to perform skin to skin on mom.  Follow-up later when mother is awake. First blood glucose was 49 per RN. Information given on support groups and outpatient services. Patient Name: Boy Quintella Reichertaula Morford ZOXWR'UToday's Date: 04/27/2016 Reason for consult: Initial assessment;Other (Comment) (IDDM)   Maternal Data Has patient been taught Hand Expression?: No (mom too sleepy)  Feeding Feeding Type: Breast Fed Length of feed: 20 min  LATCH Score/Interventions Latch: Grasps breast easily, tongue down, lips flanged, rhythmical sucking.  Audible Swallowing: Spontaneous and intermittent  Type of Nipple: Everted at rest and after stimulation  Comfort (Breast/Nipple): Soft / non-tender     Hold (Positioning): No assistance needed to correctly position infant at breast.  LATCH Score: 10  Lactation Tools Discussed/Used     Consult Status Consult Status: Follow-up Date: 04/27/16 Follow-up type: In-patient    Soyla DryerJoseph, Kimela Malstrom 04/27/2016, 9:37 AM

## 2016-04-28 LAB — GLUCOSE, CAPILLARY
GLUCOSE-CAPILLARY: 126 mg/dL — AB (ref 65–99)
Glucose-Capillary: 96 mg/dL (ref 65–99)

## 2016-04-28 MED ORDER — SENNOSIDES-DOCUSATE SODIUM 8.6-50 MG PO TABS: 2.0000 | ORAL_TABLET | Freq: Two times a day (BID) | ORAL | 2 refills | Status: DC

## 2016-04-28 MED ORDER — OXYCODONE HCL 5 MG PO TABS: 5.0000 mg | ORAL_TABLET | ORAL | 0 refills | Status: DC | PRN

## 2016-04-28 MED ORDER — IBUPROFEN 600 MG PO TABS: 600.0000 mg | ORAL_TABLET | Freq: Four times a day (QID) | ORAL | 2 refills | Status: DC

## 2016-04-28 NOTE — Progress Notes (Signed)
POSTPARTUM PROGRESS NOTE  Post Partum Day 1  Subjective:  Quintella Reichertaula Horton is a 36 y.o. G2P1011 8337w1d s/p SVD with IOL due to diabetes.  No acute events overnight.  Pt denies problems with ambulating, voiding or po intake.  She denies nausea or vomiting.  She has no pain this morning.  She has had flatus. She has not had a bowel movement.  Lochia Small.   Objective: Blood pressure 120/70, pulse 87, temperature 98.1 F (36.7 C), resp. rate 18, height 5\' 4"  (1.626 m), weight 96.2 kg (212 lb), last menstrual period 07/28/2015, SpO2 100 %; breastfeeding.  Physical Exam:  General: alert, cooperative and no distress Lochia: normal flow Chest: CTAB, normal work of breathing on room air Heart: RRR; 2/6 systolic flow murmur best heard at LUSB; 2+ distal pulses bilaterally Abdomen: +BS, soft, nontender  Uterine Fundus: firm DVT Evaluation: No calf swelling or tenderness Extremities: no edema   Recent Labs  04/26/16 0755  HGB 11.8*  HCT 36.7    Assessment/Plan:  Quintella Reichertaula Baumgart is a 36 y.o. G2P1011 6437w1d s/p SVD with IOL for diabetes. Her blood glucose is WNL this morning. Her pain is well controlled with minimal lochia, and we will plan for discharge home with rx for glyburide and metformin for her diabetes. She desires circumcision for her baby boy (plan for today) and wishes to breastfeed; she plans for Mirena for contraception.    LOS: 2 days   Ennis FortsJessica Kayven Aldaco, Medical Student [facility]  04/28/2016, 7:51 AM

## 2016-04-28 NOTE — Lactation Note (Signed)
This note was copied from a baby's chart. Lactation Consultation Note  Baby sleeping after circ. Discussed STS, depth, and cluster feeding. Provided mother w/ hand pump and reviewed use. S[facility] or [facility] to view next feeding. Reviewed engorgement care and monitoring voids/stools. Mom encouraged to feed baby 8-12 times/24 hours and with feeding cues.    Patient Name: [name] MVHQI'OToday's Date: 04/28/2016 Reason for consult: Follow-up assessment   Maternal Data    Feeding Feeding Type: Breast Fed Length of feed: 30 min  LATCH Score/Interventions                      Lactation Tools Discussed/Used     Consult Status Consult Status: Complete    [name], Ruth Boschen 04/28/2016, 11:28 AM

## 2016-04-28 NOTE — Progress Notes (Signed)
Post Partum Day #1 Subjective: no complaints, up ad lib, voiding, tolerating PO and breast feeding is going well, desires to be discharged home later today.    Objective: Blood pressure 120/70, pulse 87, temperature 98.1 F (36.7 C), resp. rate 18, height 5\' 4"  (1.626 m), weight 212 lb (96.2 kg), last menstrual period 07/28/2015, SpO2 100 %, unknown if currently breastfeeding.  Physical Exam:  General: alert, cooperative and no distress Lochia: appropriate Uterine Fundus: firm Incision: no significant drainage, no dehiscence, no significant erythema DVT Evaluation: No evidence of DVT seen on physical exam. No cords or calf tenderness. No significant calf/ankle edema.   Recent Labs  04/26/16 0755  HGB 11.8*  HCT 36.7    Assessment/Plan: Discharge home, Breastfeeding, Circumcision prior to discharge and Contraception  IUD planned for 6 weeks postpartum (Mirena).  To start back on Metformin and glyburide, DM2 to be managed by her primary care.     LOS: 2 days   Roe Coombsachelle A Oaklynn Stierwalt, CNM 04/28/2016, 8:19 AM

## 2016-04-28 NOTE — Discharge Summary (Signed)
Obstetric Discharge Summary Reason for Admission: induction of labor and at 39w for A2/BDM Prenatal Procedures: NST and ultrasound Intrapartum Procedures: spontaneous vaginal delivery Postpartum Procedures: none Complications-Operative and Postpartum: 2nd degree perineal laceration Hemoglobin  Date Value Ref Range Status  04/26/2016 11.8 (L) 12.0 - 15.0 g/dL Final   HCT  Date Value Ref Range Status  04/26/2016 36.7 36.0 - 46.0 % Final    Physical Exam:  General: alert, cooperative and no distress Lochia: appropriate Uterine Fundus: firm Incision: no significant drainage, no dehiscence, no significant erythema DVT Evaluation: No evidence of DVT seen on physical exam. No cords or calf tenderness. No significant calf/ankle edema.  Discharge Diagnoses: Term Pregnancy-delivered and DM2  Discharge Information: Date: 04/28/2016 Activity: pelvic rest Diet: routine Medications: PNV, Ibuprofen, Colace and Percocet Condition: stable Instructions: refer to practice specific booklet Discharge to: home Follow-up Information    [facility]. Schedule an appointment as soon as possible for a visit in 4 week(s).   Why:  postpartum exam Contact information: [address] [place] [phone]          Newborn Data: Live born female  Birth Weight: 8 lb 5 oz (3770 g) APGAR: 7, 10  Home with mother.  Allison Hess, CNM 04/28/2016, 8:23 AM

## 2016-05-01 ENCOUNTER — Telehealth: Payer: Self-pay

## 2016-05-01 NOTE — Telephone Encounter (Signed)
Patient called and states that she is still breaking out in a rash.   Patient thinks it could be from her epidural. (delivered on 04-27-16) Patient states that she did break out from the antibiotic she was given during labor.    Patient currently just taking ibuprofen and metformin.  Patient states rash comes and goes all day long. It is all over hives.   Patient made aware that I will route to provider for input. Armandina StammerJennifer Khiem Gargis RNBSN

## 2016-05-01 NOTE — Telephone Encounter (Signed)
    Please ask pt to take Benadryl. For 24 hours she can take 25mg  q 4 hours scheduled with Claritin once a day. It may make her a bit sleepy but, is safe for breast feeding. She can also use Aveeno oatmela lotion or calamine lotion OTC for any skin rash/itching. She needs to f/u ASAP to the ED if she notes any SOB or difficulty breathing.       Patient called and made aware of the above plan from Dr. Burnice LoganHarrawayKatrinka Blazing- Smith. Patient states understanding. Armandina StammerJennifer Howard RNBSN

## 2016-06-07 ENCOUNTER — Encounter: Payer: Self-pay | Admitting: Family Medicine

## 2016-06-07 ENCOUNTER — Ambulatory Visit (INDEPENDENT_AMBULATORY_CARE_PROVIDER_SITE_OTHER): Payer: Managed Care, Other (non HMO) | Admitting: Family Medicine

## 2016-06-07 DIAGNOSIS — Z30430 Encounter for insertion of intrauterine contraceptive device: Secondary | ICD-10-CM

## 2016-06-07 DIAGNOSIS — E119 Type 2 diabetes mellitus without complications: Secondary | ICD-10-CM

## 2016-06-07 NOTE — Progress Notes (Signed)
liletta IUD Lot [id number] Armandina StammerJennifer Kolsen Choe RN

## 2016-06-07 NOTE — Progress Notes (Signed)
Post Partum Exam  Allison Hess is a 36 y.o. 262P1011 female who presents for a postpartum visit. She is 6 week postpartum following a spontaneous vaginal delivery. I have fully reviewed the prenatal and intrapartum course. The delivery was at 39 gestational weeks.  Anesthesia: epidural. Postpartum course has been uneventful. Baby's course has been good but is dealing with constipation. Baby is feeding by . Bleeding no bleeding. Bowel function is normal. Bladder function is normal. Patient is not sexually active. Contraception method is IUD. Postpartum depression screening:neg (score:0)  The following portions of the patient's history were reviewed and updated as appropriate: allergies, current medications, past family history, past medical history, past social history, past surgical history and problem list.  Review of Systems Pertinent items are noted in HPI.    Objective:  Blood pressure 119/80, pulse 91, height 5\' 4"  (1.626 m), weight 193 lb (87.5 kg), currently breastfeeding.  General:  alert, cooperative and no distress  Lungs: clear to auscultation bilaterally  Heart:  regular rate and rhythm, S1, S2 normal, no murmur, click, rub or gallop  Abdomen: soft, non-tender; bowel sounds normal; no masses,  no organomegaly   Vulva:  normal  Vagina: normal vagina, no discharge, exudate, lesion, or erythema  Cervix:  multiparous appearance        Assessment:    No postpartum exam. DM2 Pap smear not done at today's visit.   Plan:   1. Contraception: IUD - placed today 2. Taking metformin 1000mg  BID. Going to see PCP for continued managment 3. Follow up in: 1 month or as needed.     IUD Procedure Note Patient identified, informed consent performed, signed copy in chart, time out was performed.  Urine pregnancy test negative.  Speculum placed in the vagina.  Cervix visualized.  Cleaned with Betadine x 2.  Grasped anteriorly with a single tooth tenaculum.  Uterus sounded to 10 cm.  Liletta   IUD placed per manufacturer's recommendations.  Strings trimmed to 3 cm. Tenaculum was removed, good hemostasis noted.  Patient tolerated procedure well.   Patient given post procedure instructions and Liletta care card with expiration date.  Patient is asked to check IUD strings periodically and follow up in 4-6 weeks for IUD check.

## 2016-06-09 ENCOUNTER — Ambulatory Visit: Payer: Managed Care, Other (non HMO) | Admitting: Obstetrics & Gynecology

## 2016-07-05 ENCOUNTER — Ambulatory Visit: Payer: Managed Care, Other (non HMO) | Admitting: Obstetrics & Gynecology

## 2016-07-31 ENCOUNTER — Encounter: Payer: Self-pay | Admitting: Obstetrics & Gynecology

## 2016-07-31 ENCOUNTER — Ambulatory Visit (INDEPENDENT_AMBULATORY_CARE_PROVIDER_SITE_OTHER): Payer: 59 | Admitting: Obstetrics & Gynecology

## 2016-07-31 ENCOUNTER — Ambulatory Visit: Payer: Managed Care, Other (non HMO) | Admitting: Obstetrics & Gynecology

## 2016-07-31 VITALS — BP 127/75 | HR 80 | Ht 64.0 in | Wt 197.0 lb

## 2016-07-31 DIAGNOSIS — Z30431 Encounter for routine checking of intrauterine contraceptive device: Secondary | ICD-10-CM | POA: Diagnosis not present

## 2016-07-31 NOTE — Progress Notes (Signed)
History:  36 y.o. G2P1011 here today for today for IUD string check; Mirena IUD was placed 06/07/2016. No complaints about the Mirena, no concerning side effects.  The following portions of the patient's history were reviewed and updated as appropriate: allergies, current medications, past family history, past medical history, past social history, past surgical history and problem list. Last pap smear on 09/2015 was normal, neg HRHPV.  Review of Systems:  Pertinent items are noted in HPI.   Objective:  Physical Exam Blood pressure 127/75, pulse 80, height  (1.626 m), weight 197 lb (89.4 kg), not currently breastfeeding. Gen: NAD Abd: Soft, nontender and nondistended Pelvic: Normal appearing external genitalia; normal appearing vaginal mucosa and cervix.  IUD strings visualized, about 3 cm in length outside cervix.   Assessment & Plan:  Normal IUD check. Patient to keep IUD in place for five years; can come in for removal if she desires pregnancy within the next five years. Routine preventative health maintenance measures emphasized. f/u in June 2019 for annual  Lenzi Marmo L. Harraway-Smith, M.D., [facility]

## 2016-07-31 NOTE — Patient Instructions (Signed)

## 2016-08-10 ENCOUNTER — Encounter: Payer: Managed Care, Other (non HMO) | Admitting: Diagnostic Neuroimaging

## 2016-08-14 ENCOUNTER — Encounter: Payer: Self-pay | Admitting: Diagnostic Neuroimaging

## 2016-10-02 ENCOUNTER — Encounter: Payer: Self-pay | Admitting: Obstetrics & Gynecology

## 2017-04-16 IMAGING — US US MFM OB DETAIL+14 WK
1 series · 14 of 28 positions shown · non-contrast
Comparison: none

[Series 1: us mfm ob detail+14 wk · 123 acquisitions, 14 frames shown]
[im 5/123]
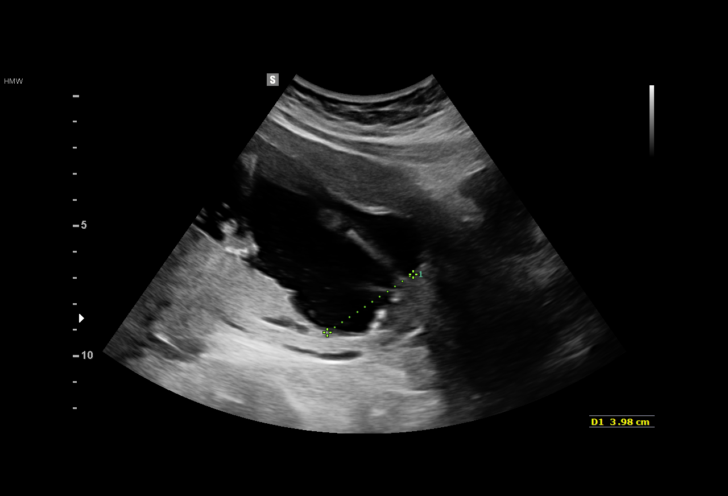
[im 14/123]
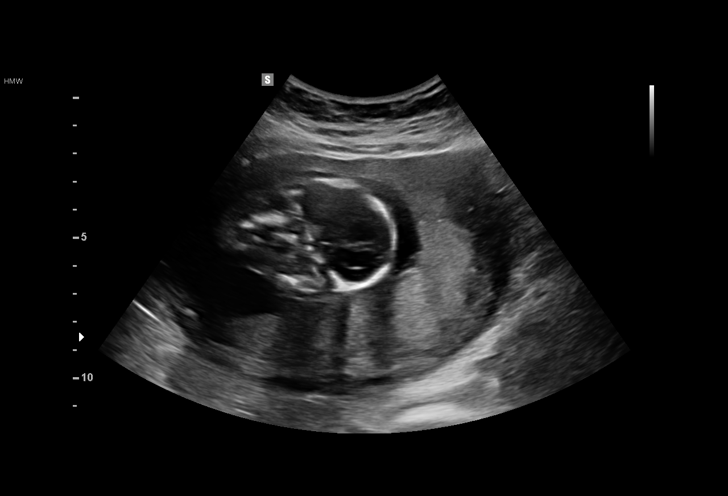
[im 23/123]
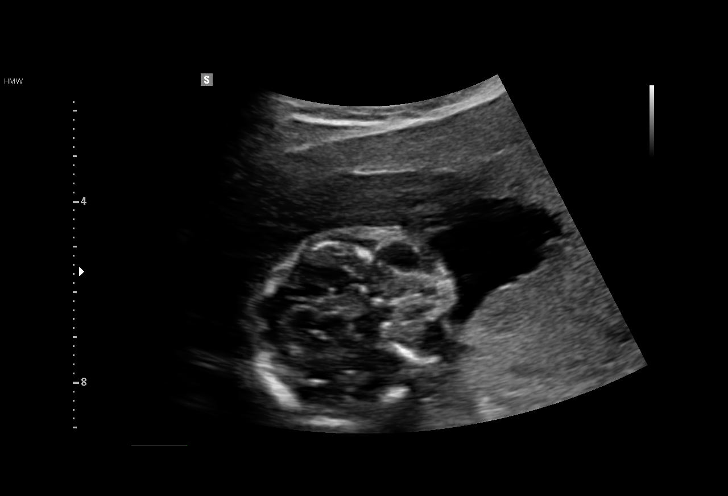
[im 32/123]
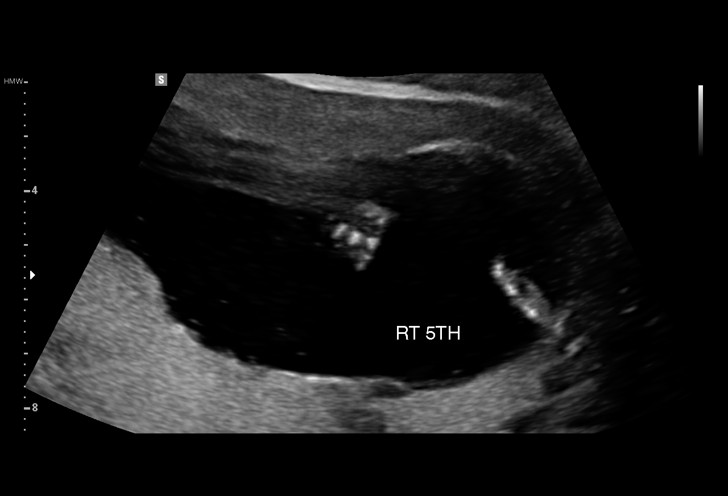
[im 41/123]
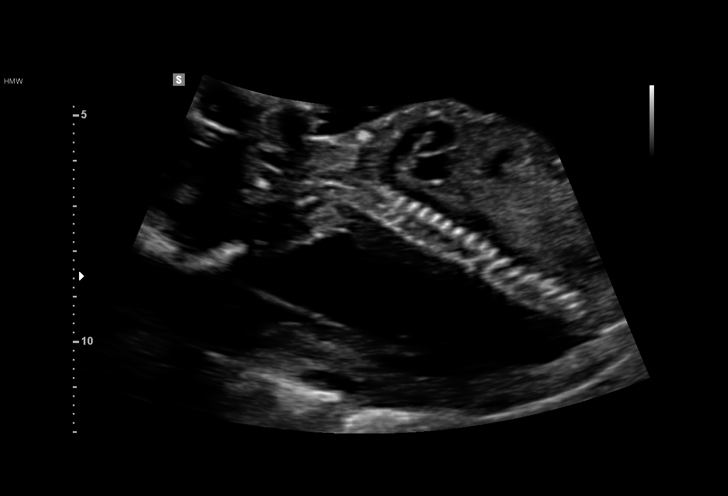
[im 50/123]
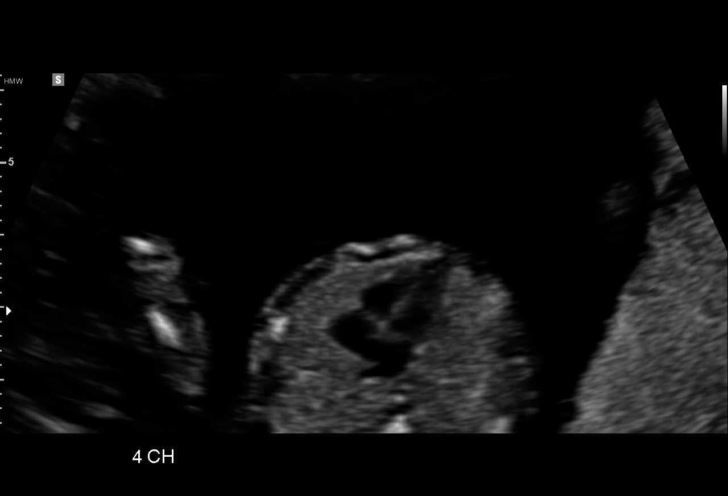
[im 59/123]
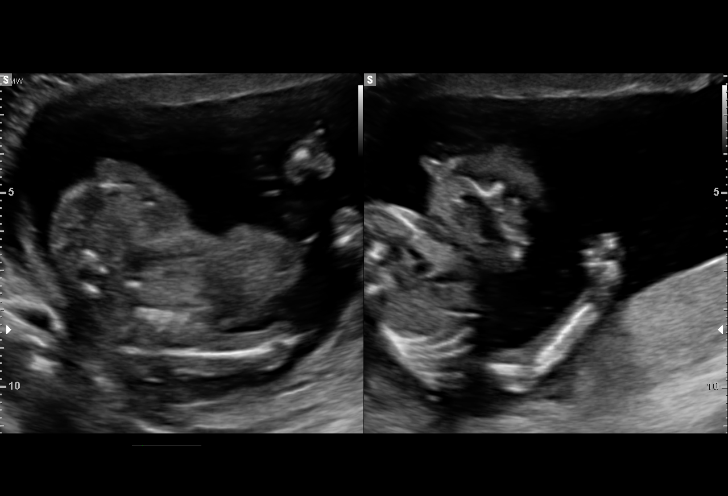
[im 68/123]
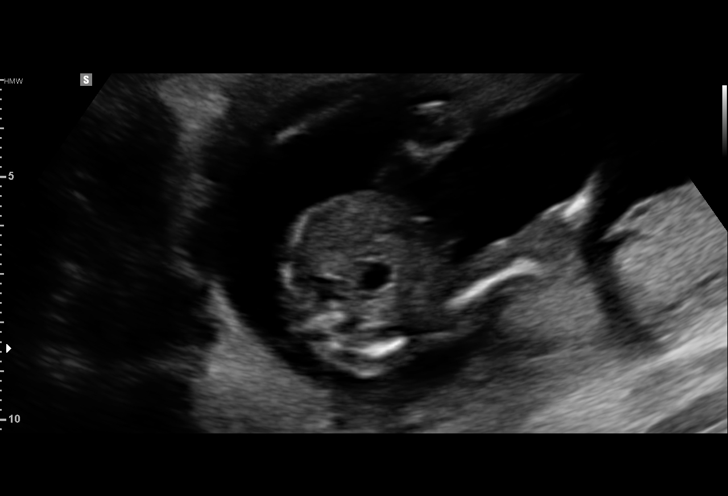
[im 77/123]
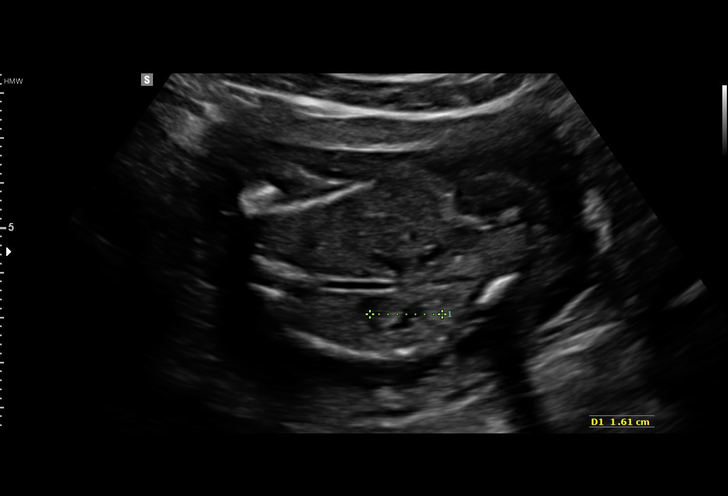
[im 86/123]
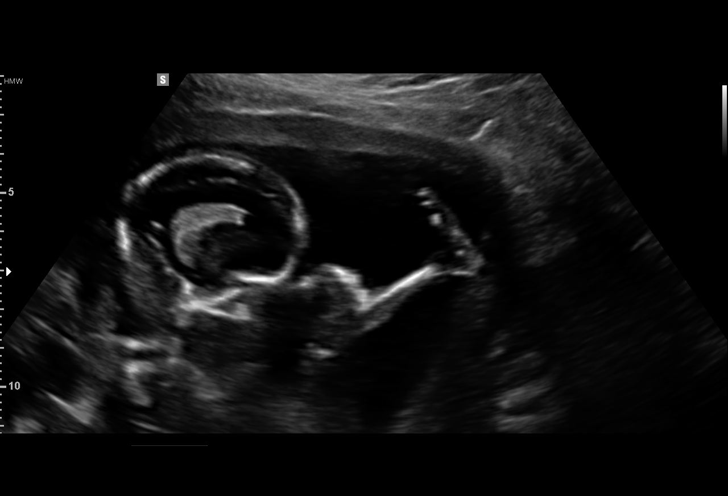
[im 95/123]
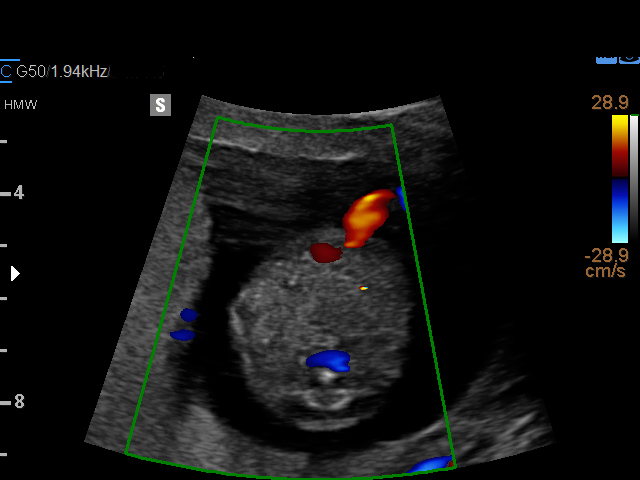
[im 104/123]
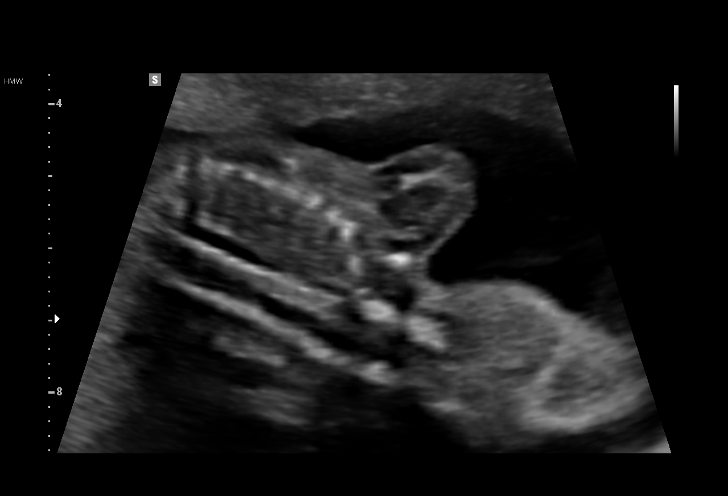
[im 113/123]
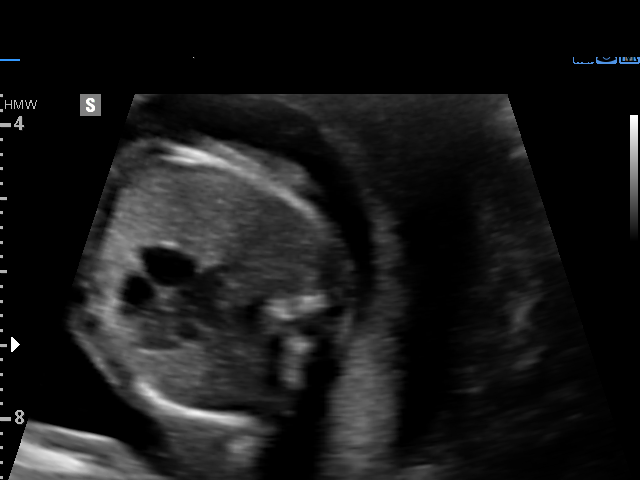
[im 123/123]
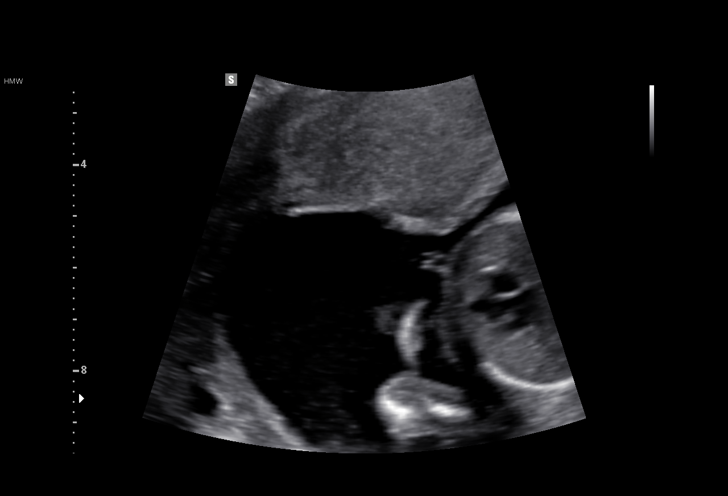

[14 of 28 positions shown; findings below may reference images not displayed]

PAULSEN

1  BC JAROSLAV JACKO            [id number]      [id number]     [id number]
Indications

18 weeks gestation of pregnancy
Pre-existing diabetes, type 2, in pregnancy,
second trimester; glyburide and metformin
Obesity complicating pregnancy, second
trimester
Abnormal fetal ultrasound: bladder outlet
obstruction
OB History

Gravidity:    2          SAB:   1
Fetal Evaluation

Num Of Fetuses:     1
Fetal Heart         138
Rate(bpm):
Cardiac Activity:   Observed
Presentation:       Breech
Placenta:           Posterior, above cervical os
P. Cord Insertion:  Visualized, central

Amniotic Fluid
AFI FV:      Subjectively within normal limits

Largest Pocket(cm)
5.3
Biometry

BPD:      41.1  mm     G. Age:  18w 3d         71  %    CI:        77.96   %   70 - 86
FL/HC:      16.8   %   15.8 - 18
HC:      147.3  mm     G. Age:  17w 6d         36  %    HC/AC:      1.11       1.07 -
AC:      132.2  mm     G. Age:  18w 5d         71  %    FL/BPD:     60.1   %
FL:       24.7  mm     G. Age:  17w 3d         25  %    FL/AC:      18.7   %   20 - 24
HUM:      23.5  mm     G. Age:  17w 2d         33  %

Est. FW:     225  gm      0 lb 8 oz     52  %
Gestational Age

LMP:           18w 0d       Date:   07/28/15                 EDD:   05/03/16
U/S Today:     18w 1d                                        EDD:   05/02/16
Best:          18w 0d    Det. By:   LMP  (07/28/15)          EDD:   05/03/16
Anatomy

Cranium:               Appears normal         Aortic Arch:            Appears normal
Cavum:                 Appears normal         Ductal Arch:            Appears normal
Ventricles:            Appears normal         Diaphragm:              Appears normal
Choroid Plexus:        Appears normal         Stomach:                Appears normal, left
sided
Cerebellum:            Appears normal         Abdomen:                Appears normal
Posterior Fossa:       Appears normal         Abdominal Wall:         Appears nml (cord
insert, abd wall)
Nuchal Fold:           Appears normal         Cord Vessels:           Appears normal (3
vessel cord)
Face:                  Appears normal         Kidneys:                Abnormal, see
(orbits and profile)
comments
Lips:                  Appears normal         Bladder:                Abnormal, see
comments
Thoracic:              Appears normal         Spine:                  Not well visualized
Heart:                 Appears normal         Upper Extremities:      Appears normal
(4CH, axis, and situs
RVOT:                  Appears normal         Lower Extremities:      Appears normal
LVOT:                  Appears normal

Other:  Fetus appears to be a male. Heels and 5th digit visualized.
Technically difficult due to fetal position.
Cervix Uterus Adnexa

Cervix
Length:            4.5  cm.
Normal appearance by transabdominal scan.

Uterus
No abnormality visualized.

Left Ovary
No adnexal mass visualized.

Right Ovary
No adnexal mass visualized.

Cul De Sac:   No free fluid seen.

Adnexa:       No abnormality visualized.
Impression

SIUP at 18+0 weeks
Bladder outlet obstruction - bladder now decompressed; thick
bladder wall; echogenic kidneys; nuchal hygroma resolved
All other interval fetal anatomy was seen and appeared
normal; anatomic survey complete except for the spine
Normal amniotic fluid volume
Measurements c/w LMP dating

The US findings were shared with Ms. Magana. The fact that
the amniotic fluid volume was normal today was encouraging;
however, the kidneys did appear to be damaged. The extent
of damage is not known and Ms. Magana understands that
there may be very little to no renal function later in the
pregnancy and/or after delivery. As long as the AFV remains
normal, there is hope for residual renal function.
Recommendations

Follow-up ultrasound in 4-6 weeks to reassess the kidneys
and AFV

## 2017-06-01 ENCOUNTER — Ambulatory Visit (INDEPENDENT_AMBULATORY_CARE_PROVIDER_SITE_OTHER): Payer: 59 | Admitting: Family Medicine

## 2017-06-01 VITALS — BP 125/79 | HR 72 | Wt 208.0 lb

## 2017-06-01 DIAGNOSIS — Z30432 Encounter for removal of intrauterine contraceptive device: Secondary | ICD-10-CM

## 2017-06-01 MED ORDER — ETONOGESTREL-ETHINYL ESTRADIOL 0.12-0.015 MG/24HR VA RING: VAGINAL_RING | VAGINAL | 3 refills | Status: DC

## 2017-06-01 NOTE — Progress Notes (Signed)
Patient Name: Allison Hess, female   DOB: 11/04/1980, 37 y.o.  MRN: [id number]  Patient desires removal for spotting and frequent yeast infections.  IUD Removal  Patient was in the dorsal lithotomy position, normal external genitalia was noted.  A speculum was placed in the patient's vagina, normal discharge was noted, no lesions. The multiparous cervix was visualized, no lesions, no abnormal discharge,  and was swabbed with Betadine using scopettes.  The strings of the IUD was grasped and pulled using ring forceps.  The IUD was successfully removed in its entirety.  Patient tolerated the procedure well.    Nuvaring prescribed.

## 2017-07-09 IMAGING — US US MFM OB FOLLOW-UP
1 series · 14 of 28 positions shown · non-contrast
Comparison: none

[Series 1: us mfm ob follow-up · 47 acquisitions, 14 frames shown]
[im 2/47]
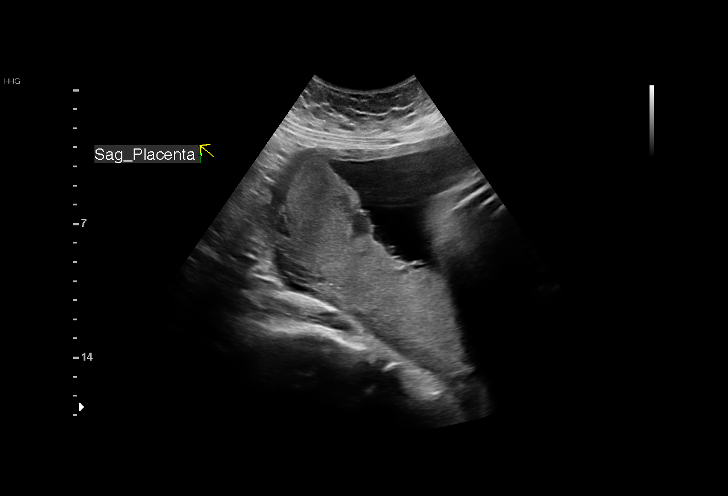
[im 6/47]
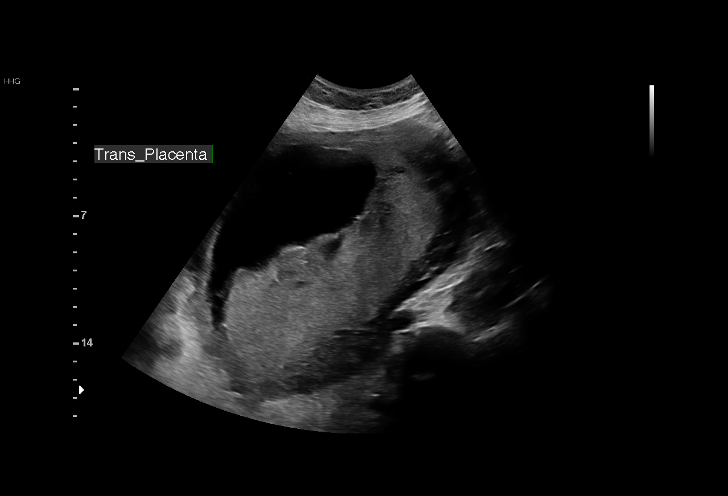
[im 9/47]
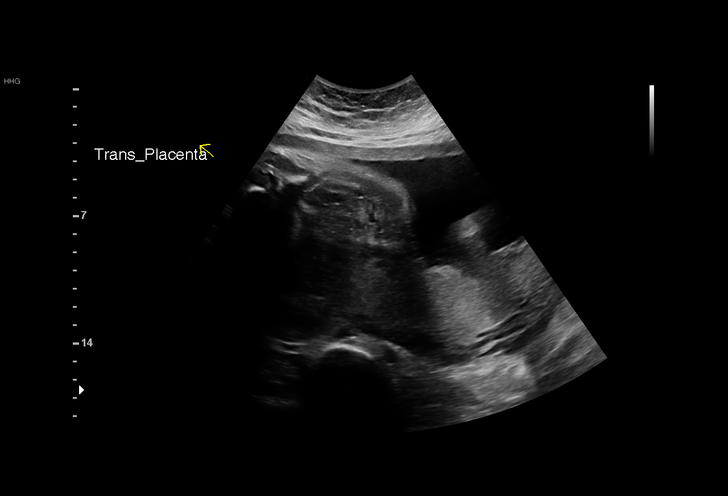
[im 12/47]
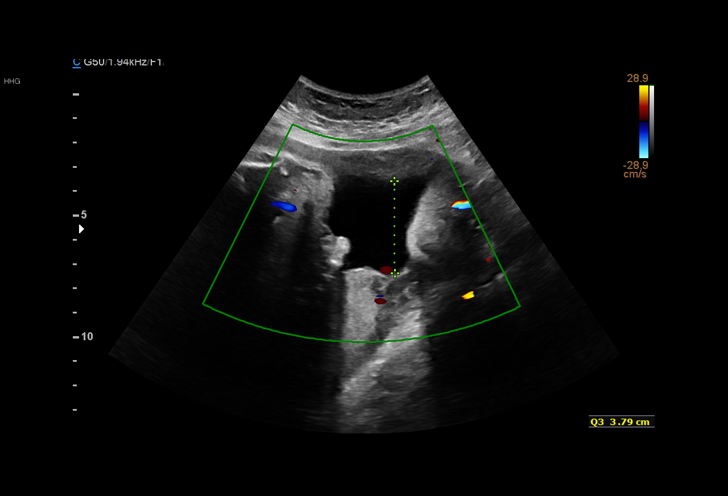
[im 16/47]
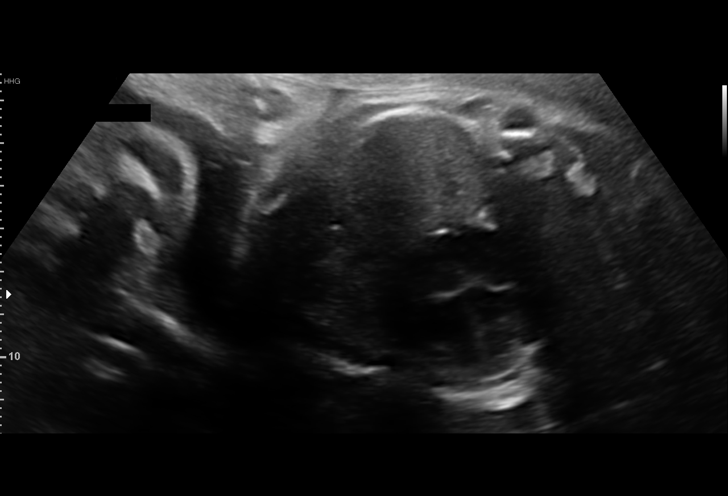
[im 19/47]
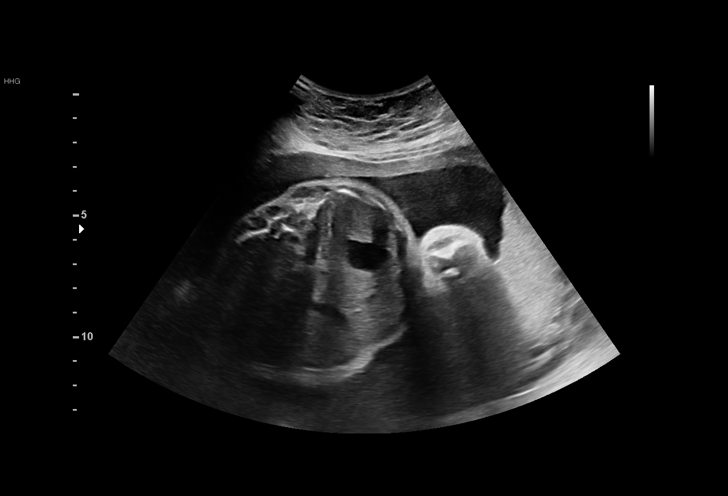
[im 23/47]
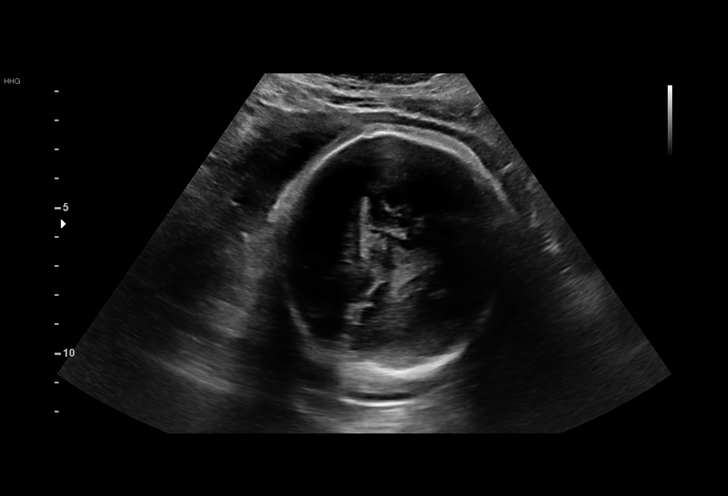
[im 26/47]
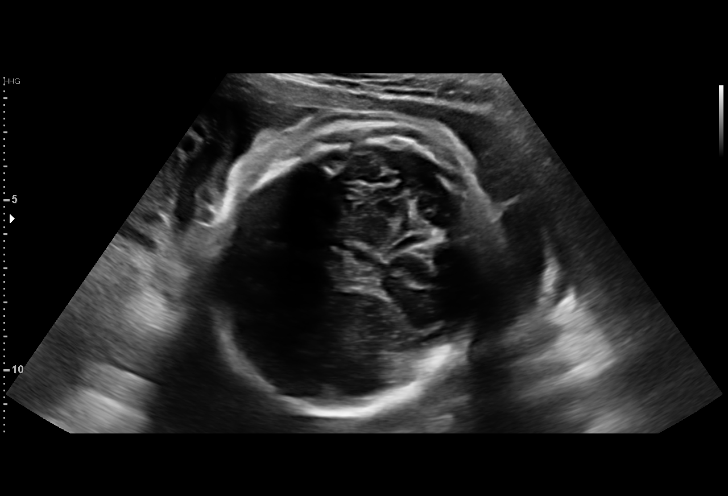
[im 29/47]
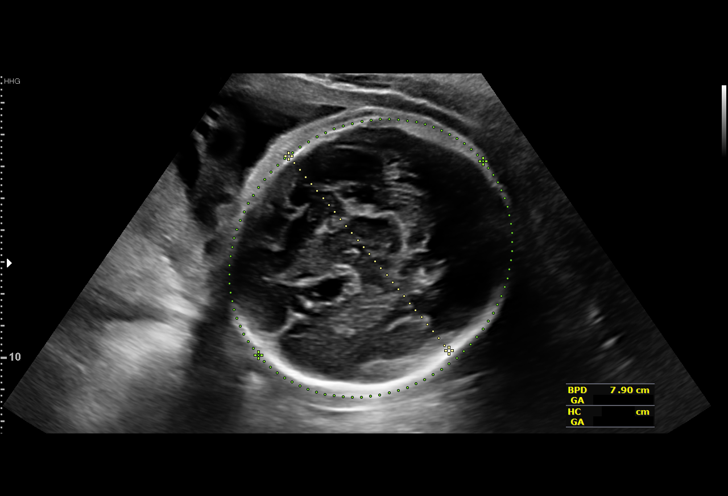
[im 33/47]
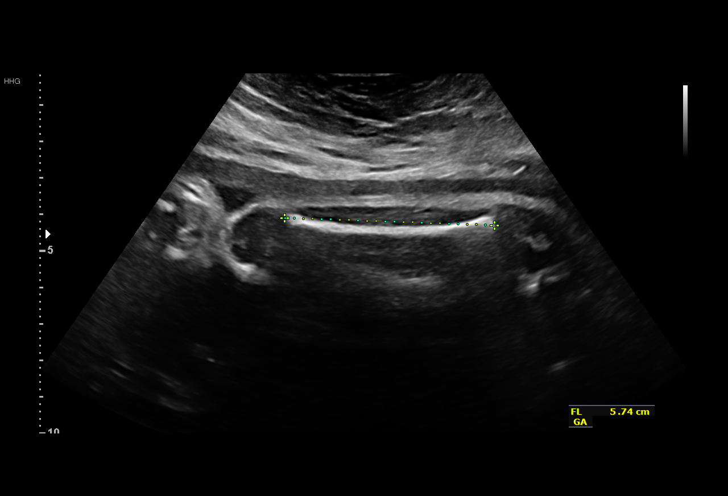
[im 36/47]
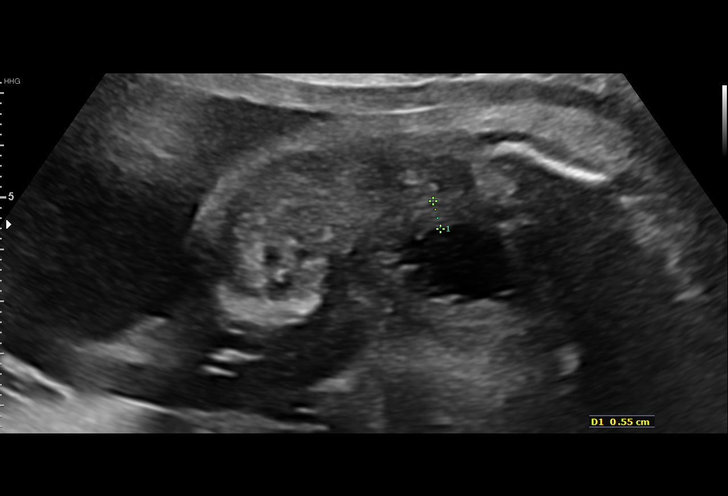
[im 40/47]
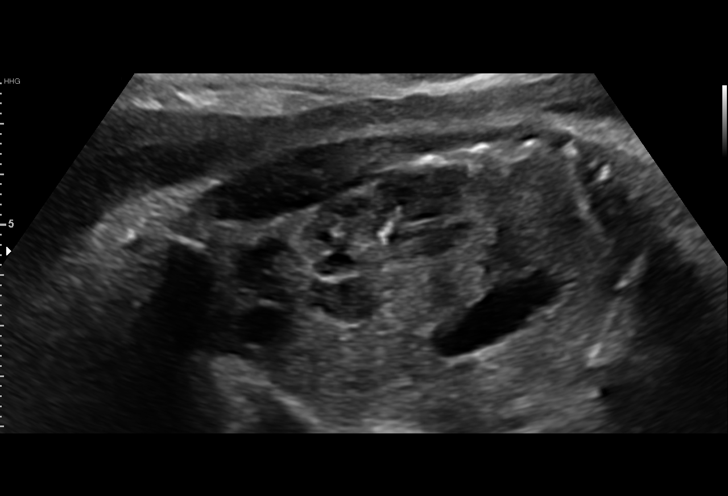
[im 43/47]
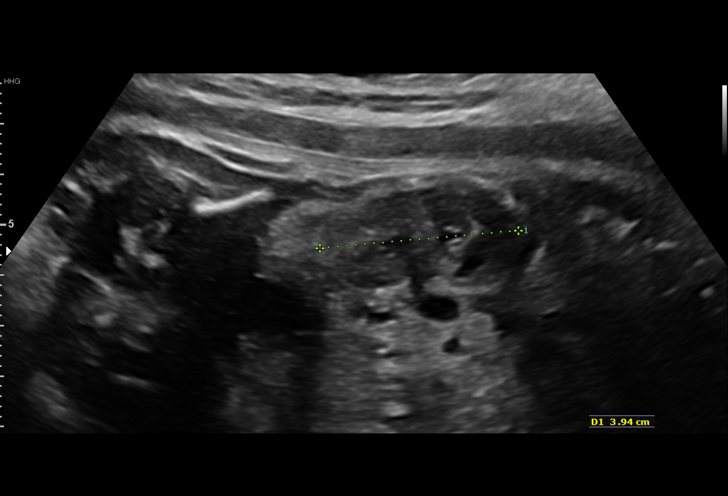
[im 47/47]
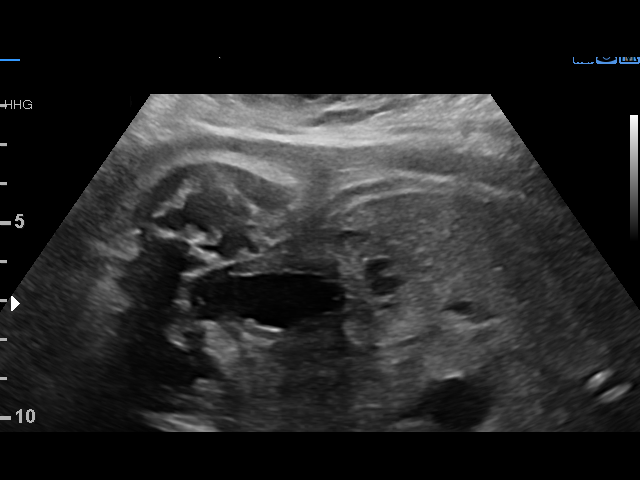

[14 of 28 positions shown; findings below may reference images not displayed]

MP

1  BALOUT SOHAIB              [id number]      [id number]     [id number]
Indications

30 weeks gestation of pregnancy
Fetal abnormality - other known or
suspected (bladder outlet obstruction)
Diabetes - Pregestational,3rd trimester
OB History

Gravidity:    2          SAB:   1
Fetal Evaluation

Num Of Fetuses:     1
Fetal Heart         143
Rate(bpm):
Cardiac Activity:   Observed
Presentation:       Cephalic
Placenta:           Posterior, above cervical os
P. Cord Insertion:  Previously Visualized

Amniotic Fluid
AFI FV:      Subjectively within normal limits

AFI Sum(cm)     %Tile       Largest Pocket(cm)
17.97           68

RUQ(cm)       RLQ(cm)       LUQ(cm)        LLQ(cm)
3.88
Biometry
BPD:      78.8  mm     G. Age:  31w 4d         85  %    CI:         83.1   %   70 - 86
FL/HC:      21.1   %   19.2 -
HC:      272.6  mm     G. Age:  29w 5d         12  %    HC/AC:      1.02       0.99 -
AC:      267.2  mm     G. Age:  30w 6d         69  %    FL/BPD:     73.0   %   71 - 87
FL:       57.5  mm     G. Age:  30w 1d         39  %    FL/AC:      21.5   %   20 - 24
HUM:      51.7  mm     G. Age:  30w 1d         56  %

Est. FW:    0828  gm      3 lb 8 oz     64  %
Gestational Age

LMP:           30w 0d       Date:   07/28/15                 EDD:   05/03/16
U/S Today:     30w 4d                                        EDD:   04/29/16
Best:          30w 0d    Det. By:   LMP  (07/28/15)          EDD:   05/03/16
Anatomy

Cranium:               Appears normal         Aortic Arch:            Previously seen
Cavum:                 Appears normal         Ductal Arch:            Previously seen
Ventricles:            Appears normal         Diaphragm:              Appears normal
Choroid Plexus:        Appears normal         Stomach:                Appears normal, left
sided
Cerebellum:            Appears normal         Abdomen:                Appears normal
Posterior Fossa:       Appears normal         Abdominal Wall:         Previously seen
Nuchal Fold:           Not applicable (>20    Cord Vessels:           Previously seen
wks GA)
Face:                  Orbits and profile     Kidneys:                Appear normal
previously seen
Lips:                  Previously seen        Bladder:                Abnormal, see
comments
Thoracic:              Appears normal         Spine:                  Previously seen
Heart:                 Appears normal         Upper Extremities:      Previously seen
(4CH, axis, and situs
RVOT:                  Appears normal         Lower Extremities:      Previously seen
LVOT:                  Appears normal

Other:  Male gender. Technically difficult due to maternal habitus and fetal
position.
Cervix Uterus Adnexa

Cervix
Not visualized (advanced GA >86wks)

Left Ovary
Not visualized.

Right Ovary
Not visualized.
Impression

Single IUP at 30w 0d
Suspected bladder outlet obstruction - the bladder is non-
distended, but the bladder wall appears thickened /
trabeculated
The fetal kidneys appear normal; do not appear echogenic on
today's study
The estimated fetal weight is at the 64th %tile.
Posterior placenta without previa
Normal amniotic fluid volume
Recommendations

Recommend antenatal testing beginning at 32 weeks (pre-
exisiting diabetes)
Ultrasound for growth in 4 weeks
Was seen by Peds urology - futher assessment after delivery

## 2017-08-11 IMAGING — US US MFM FETAL BPP W/O NON-STRESS
1 series · 13 of 14 positions shown · non-contrast
Comparison: none

[Series 1: us mfm fetal bpp w/o non-stress · 14 acquisitions, 13 frames shown]
[im 1/14]
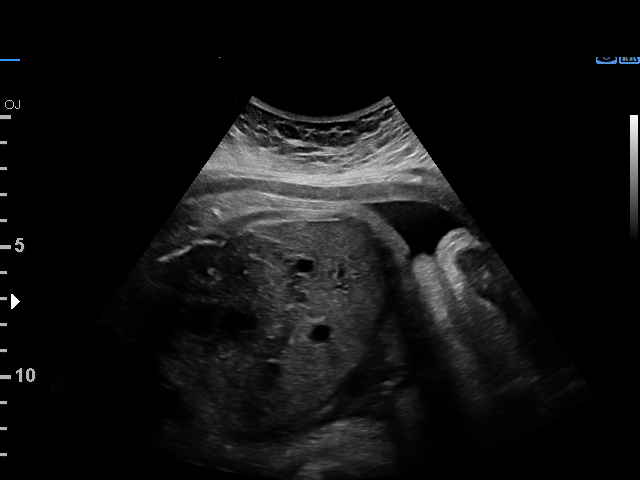
[im 2/14]
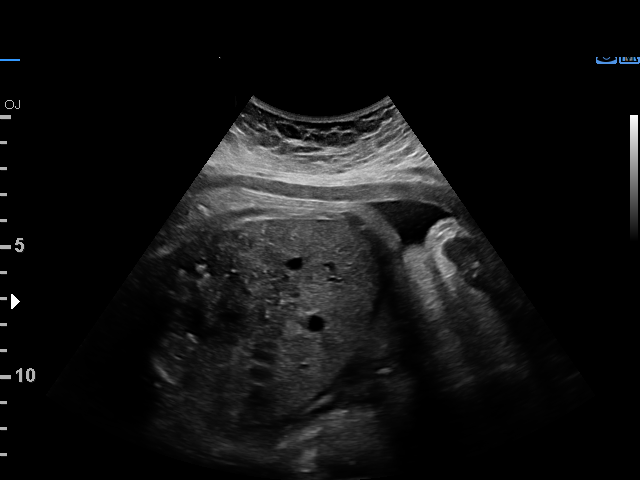
[im 3/14]
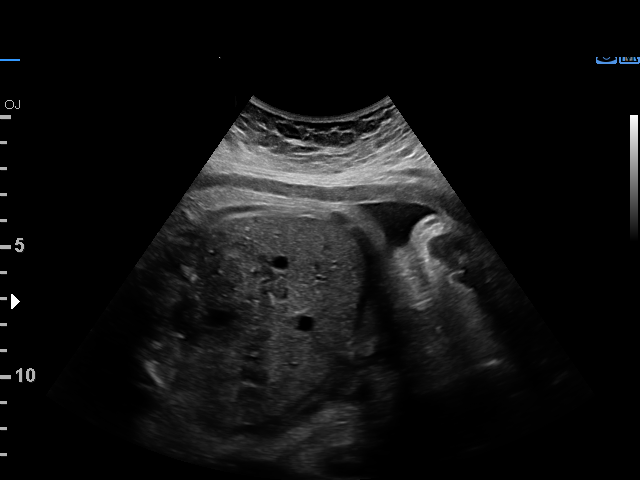
[im 4/14]
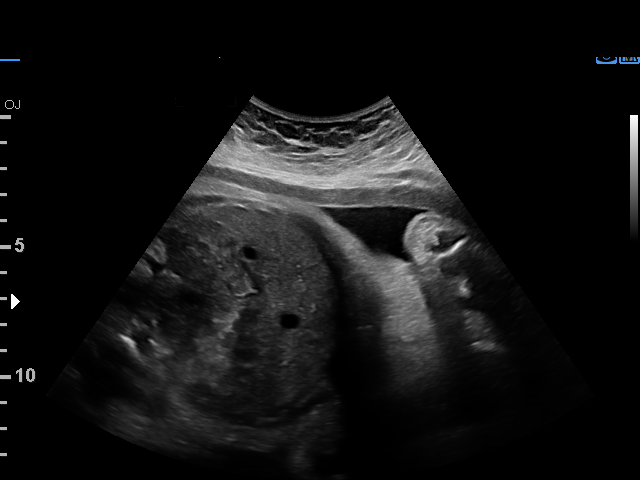
[im 5/14]
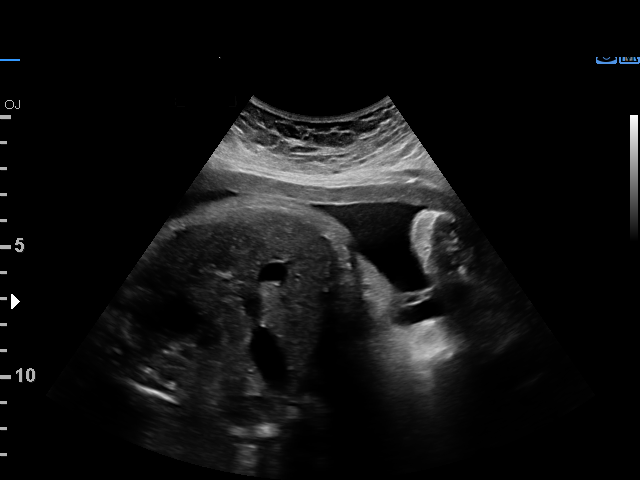
[im 6/14]
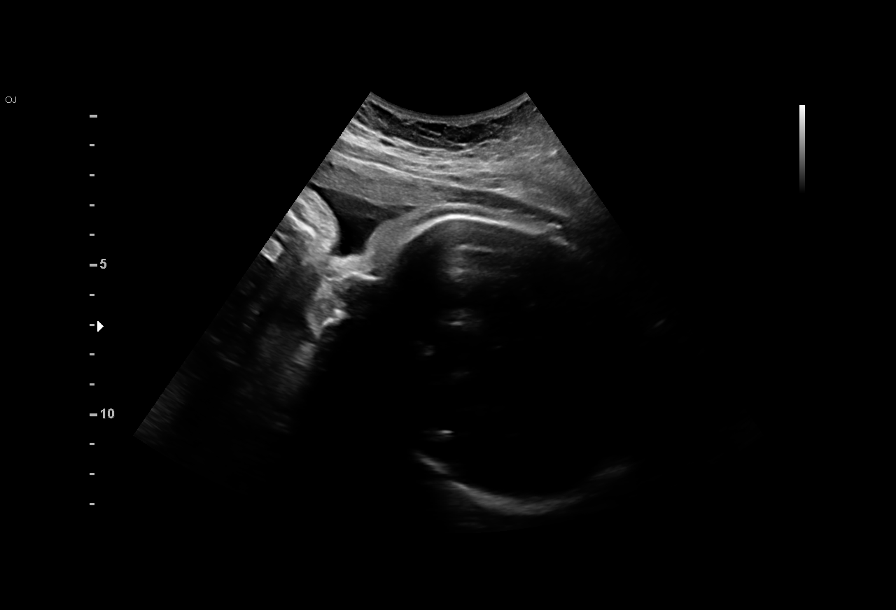
[im 8/14]
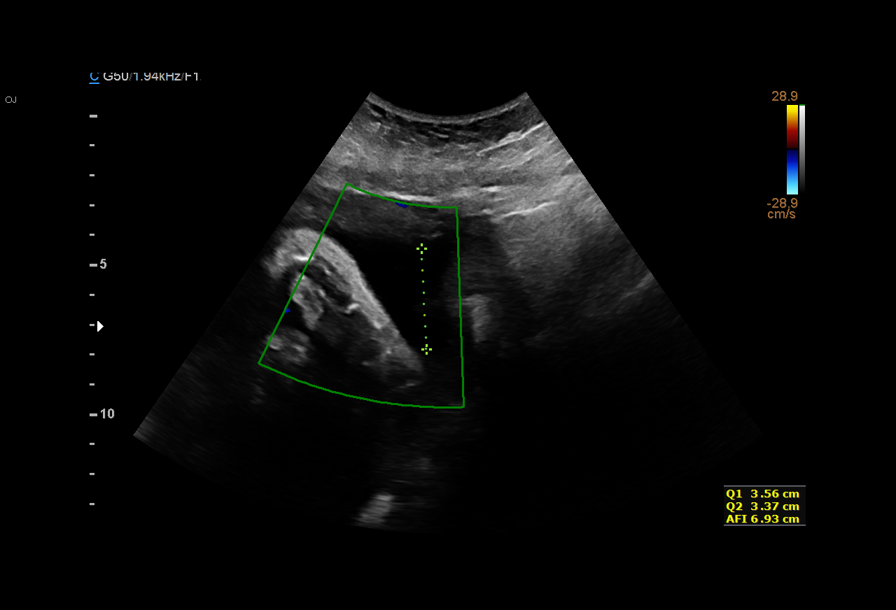
[im 9/14]
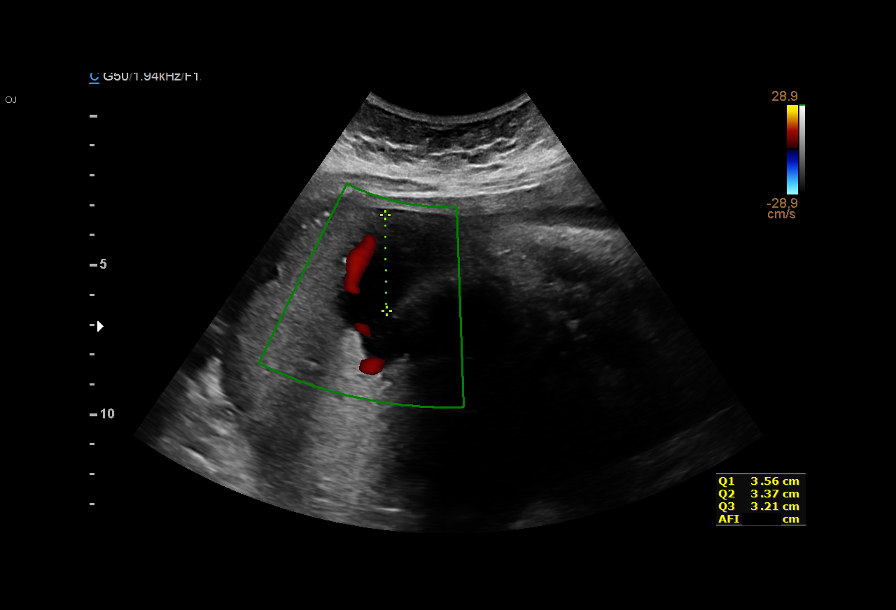
[im 10/14]
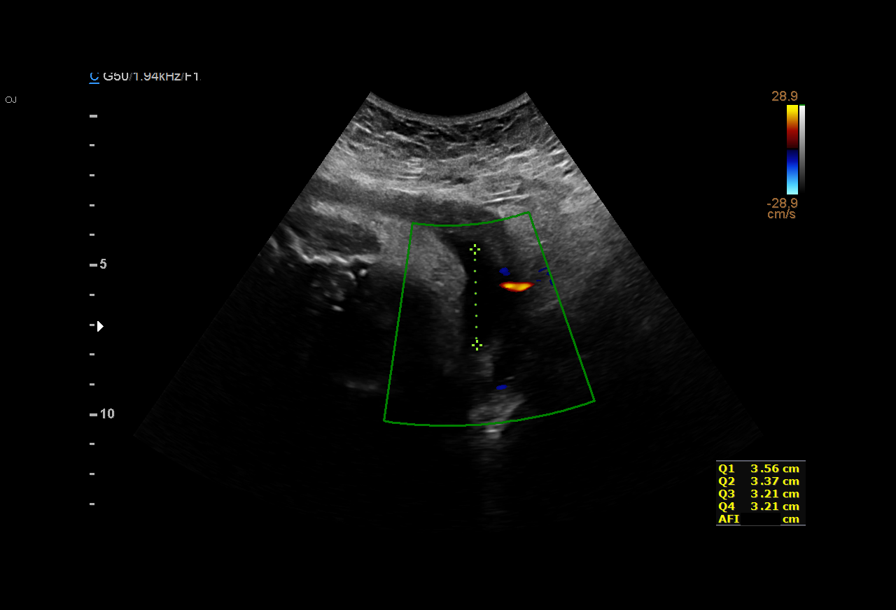
[im 11/14]
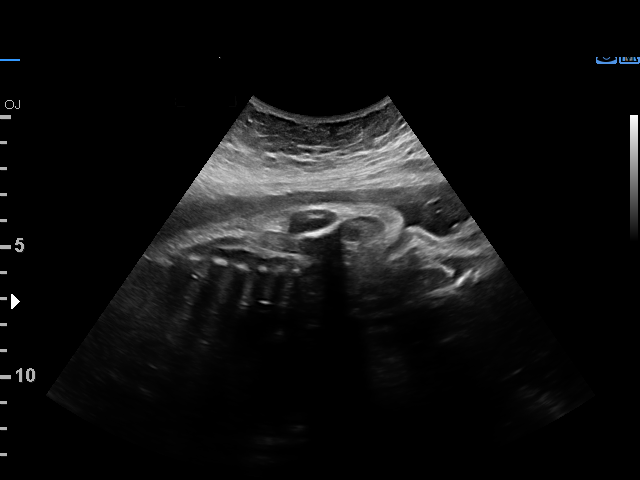
[im 12/14]
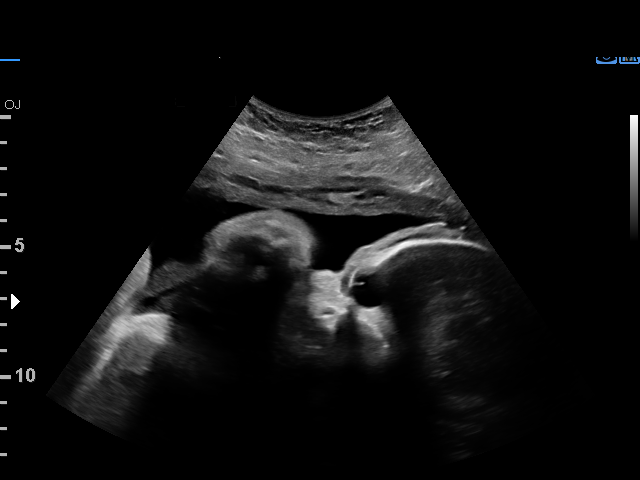
[im 13/14]
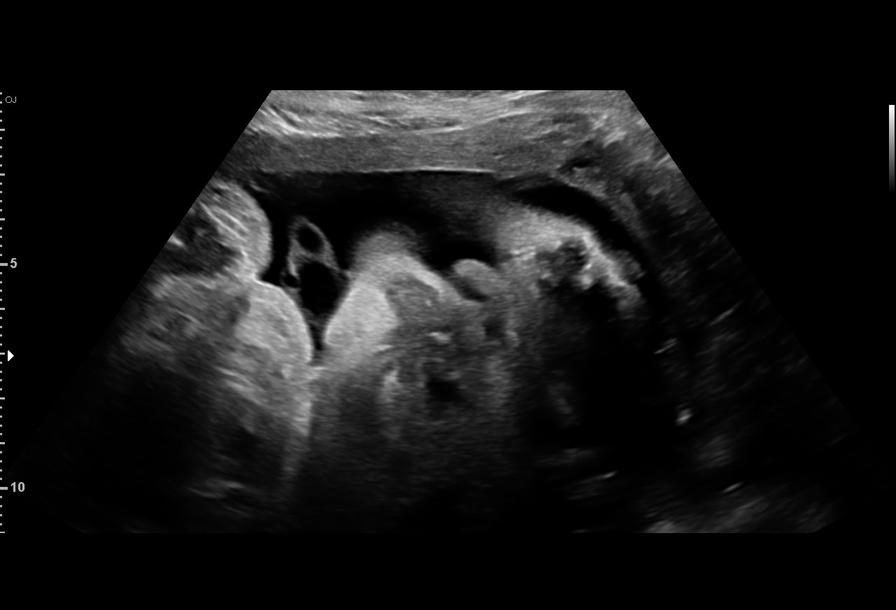
[im 14/14]
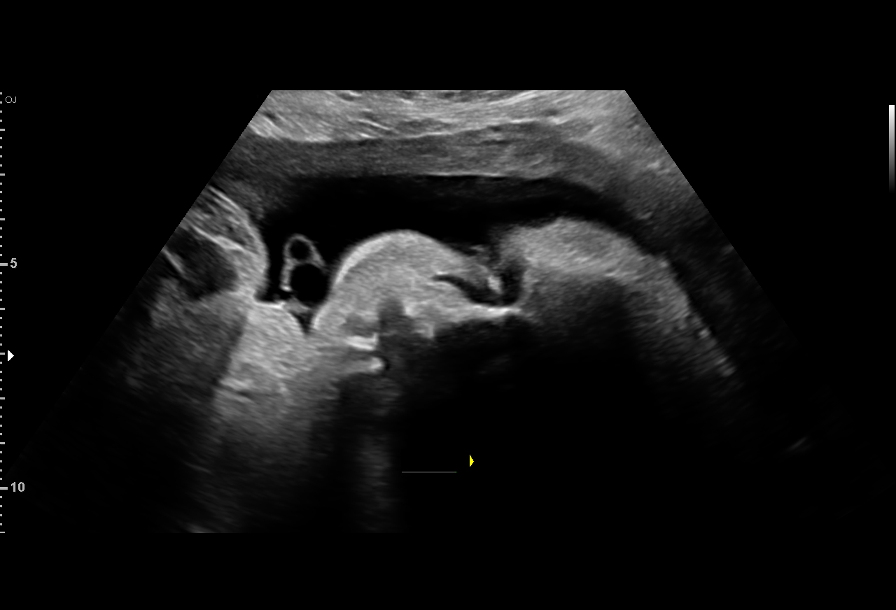

[13 of 14 positions shown; findings below may reference images not displayed]

ROSEMARY

DREY
Indications

34 weeks gestation of pregnancy
Fetal abnormality - other known or
suspected (bladder outlet obstruction)
Diabetes - Pregestational,3rd trimester
(gluburide & insulin)
Non-reactive NST
OB History

Gravidity:    2          SAB:   1
Fetal Evaluation

Num Of Fetuses:     1
Fetal Heart         152
Rate(bpm):
Cardiac Activity:   Observed
Presentation:       Cephalic

Amniotic Fluid
AFI FV:      Subjectively within normal limits

AFI Sum(cm)     %Tile       Largest Pocket(cm)
13.35           45

RUQ(cm)       RLQ(cm)       LUQ(cm)        LLQ(cm)
3.56
Biophysical Evaluation
Amniotic F.V:   Within normal limits       F. Tone:        Observed
F. Movement:    Observed                   Score:          [DATE]
F. Breathing:   Observed
Gestational Age

LMP:           34w 5d        Date:  07/28/15                 EDD:   05/03/16
Best:          34w 5d     Det. By:  LMP  (07/28/15)          EDD:   05/03/16
Impression

Single IUP at 34w 5d
Suspected bladder outlet obstruction (now resolved with
thickened bladder), type 2 diabetes
BPP [DATE]
Normal amniotic fluid volume
Recommendations

Will begin weekly BPPs
Ultrasound for growth in 3-4 weeks if undelivered

## 2018-01-14 ENCOUNTER — Encounter: Payer: Self-pay | Admitting: Family Medicine

## 2018-01-14 ENCOUNTER — Ambulatory Visit (INDEPENDENT_AMBULATORY_CARE_PROVIDER_SITE_OTHER): Payer: 59 | Admitting: Family Medicine

## 2018-01-14 VITALS — BP 118/73 | HR 85 | Ht 64.0 in | Wt 206.1 lb

## 2018-01-14 DIAGNOSIS — N898 Other specified noninflammatory disorders of vagina: Secondary | ICD-10-CM | POA: Diagnosis not present

## 2018-01-14 DIAGNOSIS — Z113 Encounter for screening for infections with a predominantly sexual mode of transmission: Secondary | ICD-10-CM

## 2018-01-14 DIAGNOSIS — B9689 Other specified bacterial agents as the cause of diseases classified elsewhere: Secondary | ICD-10-CM | POA: Diagnosis not present

## 2018-01-14 DIAGNOSIS — B373 Candidiasis of vulva and vagina: Secondary | ICD-10-CM

## 2018-01-14 DIAGNOSIS — Z01419 Encounter for gynecological examination (general) (routine) without abnormal findings: Secondary | ICD-10-CM

## 2018-01-14 DIAGNOSIS — B3731 Acute candidiasis of vulva and vagina: Secondary | ICD-10-CM

## 2018-01-14 DIAGNOSIS — N760 Acute vaginitis: Secondary | ICD-10-CM | POA: Diagnosis not present

## 2018-01-14 MED ORDER — FLUCONAZOLE 150 MG PO TABS: 150.0000 mg | ORAL_TABLET | ORAL | 3 refills | Status: AC

## 2018-01-14 NOTE — Progress Notes (Signed)
GYNECOLOGY ANNUAL PREVENTATIVE CARE ENCOUNTER NOTE  Subjective:   Allison Hess is a 37 y.o. 932P1011 female here for a routine annual gynecologic exam.  Current complaints: recurrent yeast infection for past several months. Takes OTC monostat, which clears it up. Occurs in middle of cycle. Currently using nuvaring for contraception.   Denies abnormal vaginal bleeding, discharge, pelvic pain, problems with intercourse or other gynecologic concerns.    Gynecologic History Patient's last menstrual period was 01/06/2018. Patient is not sexually active  Contraception: NuvaRing vaginal inserts Last Pap: 2017. Results were: normal Last mammogram: n/a  Obstetric History OB History  Gravida Para Term Preterm AB Living  2 1 1   1 1   SAB TAB Ectopic Multiple Live Births  1     0 1    # Outcome Date GA Lbr Len/2nd Weight Sex Delivery Anes PTL Lv  2 Term 04/27/16 [id number] 08:46 / 01:37 8 lb 5 oz (3.77 kg) M Vag-Spont EPI  LIV  1 SAB             Past Medical History:  Diagnosis Date  . Diabetes mellitus without complication (HCC)     No past surgical history on file.  Current Outpatient Medications on File Prior to Visit  Medication Sig Dispense Refill  . etonogestrel-ethinyl estradiol (NUVARING) 0.12-0.015 MG/24HR vaginal ring Insert vaginally and leave in place for 3 consecutive weeks, then remove for 1 week. 3 each 3  . FARXIGA 5 MG TABS tablet TAKE 1 TABLET (5 MG TOTAL) BY MOUTH DAILY. INCREASE TO 2 TABS (10 MG) DAILY AFTER 1 WEEK.  0  . JANUMET 50-1000 MG tablet     . ibuprofen (ADVIL,MOTRIN) 600 MG tablet Take 1 tablet (600 mg total) by mouth every 6 (six) hours. (Patient not taking: Reported on 06/01/2017) 120 tablet 2  . metFORMIN (GLUCOPHAGE) 1000 MG tablet Take 1 tablet (1,000 mg total) by mouth 2 (two) times daily with a meal. (Patient not taking: Reported on 07/31/2016) 60 tablet 3  . oxyCODONE (OXY IR/ROXICODONE) 5 MG immediate release tablet Take 1 tablet (5 mg total) by mouth  every 4 (four) hours as needed (pain scale 4-7). (Patient not taking: Reported on 07/31/2016) 30 tablet 0  . phentermine 37.5 MG capsule TAKE 1 CAPSULE (37.5 MG TOTAL) BY MOUTH EVERY MORNING.  2  . senna-docusate (SENOKOT-S) 8.6-50 MG tablet Take 2 tablets by mouth 2 (two) times daily. (Patient not taking: Reported on 06/01/2017) 90 tablet 2   No current facility-administered medications on file prior to visit.     Allergies  Allergen Reactions  . Erythromycin Rash  . Penicillins Hives    Has patient had a PCN reaction causing immediate rash, facial/tongue/throat swelling, SOB or lightheadedness with hypotension: Yes Has patient had a PCN reaction causing severe rash involving mucus membranes or skin necrosis: No Has patient had a PCN reaction that required hospitalization No Has patient had a PCN reaction occurring within the last 10 years: No If all of the above answers are "NO", then may proceed with Cephalosporin use.   Marland Kitchen. Amoxicillin Rash    Has patient had a PCN reaction causing immediate rash, facial/tongue/throat swelling, SOB or lightheadedness with hypotension: Yes Has patient had a PCN reaction causing severe rash involving mucus membranes or skin necrosis: No Has patient had a PCN reaction that required hospitalization No Has patient had a PCN reaction occurring within the last 10 years: No If all of the above answers are "NO", then may proceed with  Cephalosporin use.     Social History   Socioeconomic History  . Marital status: Single    Spouse name: Not on file  . Number of children: Not on file  . Years of education: Not on file  . Highest education level: Not on file  Occupational History  . Occupation: Disability intake  Social Needs  . Financial resource strain: Not on file  . Food insecurity:    Worry: Not on file    Inability: Not on file  . Transportation needs:    Medical: Not on file    Non-medical: Not on file  Tobacco Use  . Smoking status: Former  Smoker    Packs/day: 1.00    Years: 5.00    Pack years: 5.00    Types: Cigarettes    Last attempt to quit: 09/22/2005    Years since quitting: 12.3  . Smokeless tobacco: Never Used  Substance and Sexual Activity  . Alcohol use: No    Alcohol/week: 0.0 standard drinks  . Drug use: No  . Sexual activity: Yes    Partners: Male    Birth control/protection: None  Lifestyle  . Physical activity:    Days per week: Not on file    Minutes per session: Not on file  . Stress: Not on file  Relationships  . Social connections:    Talks on phone: Not on file    Gets together: Not on file    Attends religious service: Not on file    Active member of club or organization: Not on file    Attends meetings of clubs or organizations: Not on file    Relationship status: Not on file  . Intimate partner violence:    Fear of current or ex partner: Not on file    Emotionally abused: Not on file    Physically abused: Not on file    Forced sexual activity: Not on file  Other Topics Concern  . Not on file  Social History Narrative  . Not on file    Family History  Problem Relation Age of Onset  . Diabetes Mother   . Hypertension Mother   . Diabetes Father   . Hypertension Father   . Cancer Maternal Grandmother        breast    The following portions of the patient's history were reviewed and updated as appropriate: allergies, current medications, past family history, past medical history, past social history, past surgical history and problem list.  Review of Systems Pertinent items noted in HPI and remainder of comprehensive ROS otherwise negative.   Objective:  BP 118/73   Pulse 85   Ht 5\' 4"  (1.626 m)   Wt 206 lb 1.9 oz (93.5 kg)   LMP 01/06/2018   BMI 35.38 kg/m  CONSTITUTIONAL: Well-developed, well-nourished female in no acute distress.  HENT:  Normocephalic, atraumatic, External right and left ear normal. Oropharynx is clear and moist EYES: Conjunctivae and EOM are normal.  Pupils are equal, round, and reactive to light. No scleral icterus.  NECK: Normal range of motion, supple, no masses.  Normal thyroid.   CARDIOVASCULAR: Normal heart rate noted, regular rhythm RESPIRATORY: Clear to auscultation bilaterally. Effort and breath sounds normal, no problems with respiration noted. BREASTS: Symmetric in size. No masses, skin changes, nipple drainage, or lymphadenopathy. ABDOMEN: Soft, normal bowel sounds, no distention noted.  No tenderness, rebound or guarding.  PELVIC: Normal appearing external genitalia; normal appearing vaginal mucosa and cervix.  No abnormal discharge noted.  Pap smear obtained.  Normal uterine size, no other palpable masses, no uterine or adnexal tenderness. MUSCULOSKELETAL: Normal range of motion. No tenderness.  No cyanosis, clubbing, or edema.  2+ distal pulses. SKIN: Skin is warm and dry. No rash noted. Not diaphoretic. No erythema. No pallor. NEUROLOGIC: Alert and oriented to person, place, and time. Normal reflexes, muscle tone coordination. No cranial nerve deficit noted. PSYCHIATRIC: Normal mood and affect. Normal behavior. Normal judgment and thought content.  Assessment:  Annual gynecologic examination with pap smear   Plan:  1. Well Woman Exam Will follow up results of pap smear and manage accordingly. STD testing discussed. Patient requested testing - vaginal testing only desired - Cervicovaginal ancillary only  2. Vulvovaginitis due to yeast Discussed control of CBGs important for control of yeast infection. Prolonged course of diflucan prescribed. Nuvaring may by increasing yeast infection - may need to switch to something else. Recommended probiotic. - Cervicovaginal ancillary only   Routine preventative health maintenance measures emphasized. Please refer to After Visit Summary for other counseling recommendations.    Candelaria Celeste, DO [facility]

## 2018-01-15 LAB — CERVICOVAGINAL ANCILLARY ONLY
Bacterial vaginitis: POSITIVE — AB
CANDIDA VAGINITIS: NEGATIVE
CHLAMYDIA, DNA PROBE: NEGATIVE
NEISSERIA GONORRHEA: NEGATIVE
Trichomonas: NEGATIVE

## 2018-01-16 ENCOUNTER — Other Ambulatory Visit: Payer: Self-pay | Admitting: Family Medicine

## 2018-01-16 MED ORDER — METRONIDAZOLE 500 MG PO TABS: 500.0000 mg | ORAL_TABLET | Freq: Two times a day (BID) | ORAL | 0 refills | Status: AC

## 2018-04-04 ENCOUNTER — Other Ambulatory Visit: Payer: Self-pay | Admitting: Family Medicine

## 2018-11-22 ENCOUNTER — Ambulatory Visit: Payer: 59 | Admitting: Obstetrics & Gynecology

## 2018-11-22 DIAGNOSIS — Z01419 Encounter for gynecological examination (general) (routine) without abnormal findings: Secondary | ICD-10-CM
